# Patient Record
Sex: Female | Born: 1945 | Race: Black or African American | Hispanic: No | Marital: Married | Smoking: Never smoker
Health system: Southern US, Community
[De-identification: ages and names within clinical notes are randomized; demographics above are authoritative.]

## PROBLEM LIST (undated history)

## (undated) DIAGNOSIS — I10 Essential (primary) hypertension: Secondary | ICD-10-CM

## (undated) DIAGNOSIS — E079 Disorder of thyroid, unspecified: Secondary | ICD-10-CM

## (undated) HISTORY — DX: Disorder of thyroid, unspecified: E07.9

## (undated) HISTORY — DX: Essential (primary) hypertension: I10

## (undated) HISTORY — PX: BREAST SURGERY: SHX581

---

## 2004-10-22 ENCOUNTER — Ambulatory Visit: Payer: Self-pay

## 2005-11-22 ENCOUNTER — Ambulatory Visit: Payer: Self-pay | Admitting: Internal Medicine

## 2006-12-12 ENCOUNTER — Ambulatory Visit: Payer: Self-pay | Admitting: Internal Medicine

## 2007-01-23 ENCOUNTER — Ambulatory Visit: Payer: Self-pay | Admitting: Gastroenterology

## 2007-12-13 ENCOUNTER — Ambulatory Visit: Payer: Self-pay | Admitting: Internal Medicine

## 2008-12-16 ENCOUNTER — Ambulatory Visit: Payer: Self-pay | Admitting: Internal Medicine

## 2009-10-09 ENCOUNTER — Ambulatory Visit: Payer: Self-pay | Admitting: Internal Medicine

## 2009-12-17 ENCOUNTER — Ambulatory Visit: Payer: Self-pay | Admitting: Internal Medicine

## 2010-12-21 ENCOUNTER — Ambulatory Visit: Payer: Self-pay | Admitting: Internal Medicine

## 2011-12-29 ENCOUNTER — Ambulatory Visit: Payer: Self-pay | Admitting: Internal Medicine

## 2012-02-24 ENCOUNTER — Ambulatory Visit: Payer: Self-pay | Admitting: Gastroenterology

## 2013-01-18 ENCOUNTER — Ambulatory Visit: Payer: Self-pay | Admitting: Internal Medicine

## 2014-01-30 ENCOUNTER — Ambulatory Visit: Payer: Self-pay | Admitting: Internal Medicine

## 2014-08-18 DIAGNOSIS — H2513 Age-related nuclear cataract, bilateral: Secondary | ICD-10-CM | POA: Diagnosis not present

## 2014-10-30 DIAGNOSIS — I10 Essential (primary) hypertension: Secondary | ICD-10-CM | POA: Diagnosis not present

## 2014-10-30 DIAGNOSIS — Z124 Encounter for screening for malignant neoplasm of cervix: Secondary | ICD-10-CM | POA: Diagnosis not present

## 2014-10-30 DIAGNOSIS — E049 Nontoxic goiter, unspecified: Secondary | ICD-10-CM | POA: Diagnosis not present

## 2014-10-30 DIAGNOSIS — Z0001 Encounter for general adult medical examination with abnormal findings: Secondary | ICD-10-CM | POA: Diagnosis not present

## 2014-10-30 DIAGNOSIS — R300 Dysuria: Secondary | ICD-10-CM | POA: Diagnosis not present

## 2014-10-30 DIAGNOSIS — E539 Vitamin B deficiency, unspecified: Secondary | ICD-10-CM | POA: Diagnosis not present

## 2014-11-20 DIAGNOSIS — E049 Nontoxic goiter, unspecified: Secondary | ICD-10-CM | POA: Diagnosis not present

## 2015-02-06 DIAGNOSIS — Z1231 Encounter for screening mammogram for malignant neoplasm of breast: Secondary | ICD-10-CM | POA: Diagnosis not present

## 2015-02-20 DIAGNOSIS — Z23 Encounter for immunization: Secondary | ICD-10-CM | POA: Diagnosis not present

## 2015-11-05 DIAGNOSIS — I10 Essential (primary) hypertension: Secondary | ICD-10-CM | POA: Diagnosis not present

## 2015-11-05 DIAGNOSIS — Z0001 Encounter for general adult medical examination with abnormal findings: Secondary | ICD-10-CM | POA: Diagnosis not present

## 2015-11-05 DIAGNOSIS — E049 Nontoxic goiter, unspecified: Secondary | ICD-10-CM | POA: Diagnosis not present

## 2015-12-06 DIAGNOSIS — Z23 Encounter for immunization: Secondary | ICD-10-CM | POA: Diagnosis not present

## 2015-12-16 DIAGNOSIS — E042 Nontoxic multinodular goiter: Secondary | ICD-10-CM | POA: Diagnosis not present

## 2015-12-24 DIAGNOSIS — E042 Nontoxic multinodular goiter: Secondary | ICD-10-CM | POA: Diagnosis not present

## 2015-12-24 DIAGNOSIS — L723 Sebaceous cyst: Secondary | ICD-10-CM | POA: Diagnosis not present

## 2016-02-12 DIAGNOSIS — M810 Age-related osteoporosis without current pathological fracture: Secondary | ICD-10-CM | POA: Diagnosis not present

## 2016-02-12 DIAGNOSIS — Z1231 Encounter for screening mammogram for malignant neoplasm of breast: Secondary | ICD-10-CM | POA: Diagnosis not present

## 2016-02-19 DIAGNOSIS — Z23 Encounter for immunization: Secondary | ICD-10-CM | POA: Diagnosis not present

## 2016-08-11 DIAGNOSIS — H524 Presbyopia: Secondary | ICD-10-CM | POA: Diagnosis not present

## 2016-08-11 DIAGNOSIS — H35033 Hypertensive retinopathy, bilateral: Secondary | ICD-10-CM | POA: Diagnosis not present

## 2016-11-10 DIAGNOSIS — I10 Essential (primary) hypertension: Secondary | ICD-10-CM | POA: Diagnosis not present

## 2016-11-10 DIAGNOSIS — Z0001 Encounter for general adult medical examination with abnormal findings: Secondary | ICD-10-CM | POA: Diagnosis not present

## 2016-11-10 DIAGNOSIS — E042 Nontoxic multinodular goiter: Secondary | ICD-10-CM | POA: Diagnosis not present

## 2016-11-10 DIAGNOSIS — E559 Vitamin D deficiency, unspecified: Secondary | ICD-10-CM | POA: Diagnosis not present

## 2016-11-10 DIAGNOSIS — E039 Hypothyroidism, unspecified: Secondary | ICD-10-CM | POA: Diagnosis not present

## 2016-11-10 DIAGNOSIS — M5418 Radiculopathy, sacral and sacrococcygeal region: Secondary | ICD-10-CM | POA: Diagnosis not present

## 2016-11-28 DIAGNOSIS — E042 Nontoxic multinodular goiter: Secondary | ICD-10-CM | POA: Diagnosis not present

## 2017-02-08 DIAGNOSIS — H33032 Retinal detachment with giant retinal tear, left eye: Secondary | ICD-10-CM | POA: Diagnosis not present

## 2017-02-08 DIAGNOSIS — H4312 Vitreous hemorrhage, left eye: Secondary | ICD-10-CM | POA: Diagnosis not present

## 2017-02-15 DIAGNOSIS — H4312 Vitreous hemorrhage, left eye: Secondary | ICD-10-CM | POA: Diagnosis not present

## 2017-02-17 DIAGNOSIS — Z23 Encounter for immunization: Secondary | ICD-10-CM | POA: Diagnosis not present

## 2017-02-17 DIAGNOSIS — Z1231 Encounter for screening mammogram for malignant neoplasm of breast: Secondary | ICD-10-CM | POA: Diagnosis not present

## 2017-02-24 DIAGNOSIS — H4312 Vitreous hemorrhage, left eye: Secondary | ICD-10-CM | POA: Diagnosis not present

## 2017-03-31 DIAGNOSIS — H4312 Vitreous hemorrhage, left eye: Secondary | ICD-10-CM | POA: Diagnosis not present

## 2017-06-26 DIAGNOSIS — H2513 Age-related nuclear cataract, bilateral: Secondary | ICD-10-CM | POA: Diagnosis not present

## 2017-11-16 ENCOUNTER — Ambulatory Visit (INDEPENDENT_AMBULATORY_CARE_PROVIDER_SITE_OTHER): Payer: Medicare Other | Admitting: Nurse Practitioner

## 2017-11-16 ENCOUNTER — Other Ambulatory Visit: Payer: Self-pay

## 2017-11-16 ENCOUNTER — Encounter (INDEPENDENT_AMBULATORY_CARE_PROVIDER_SITE_OTHER): Payer: Self-pay

## 2017-11-16 ENCOUNTER — Other Ambulatory Visit: Payer: Self-pay | Admitting: Nurse Practitioner

## 2017-11-16 ENCOUNTER — Encounter: Payer: Self-pay | Admitting: Nurse Practitioner

## 2017-11-16 VITALS — BP 125/83 | HR 86 | Resp 16 | Ht 66.0 in | Wt 145.6 lb

## 2017-11-16 DIAGNOSIS — Z1239 Encounter for other screening for malignant neoplasm of breast: Secondary | ICD-10-CM

## 2017-11-16 DIAGNOSIS — M25512 Pain in left shoulder: Secondary | ICD-10-CM

## 2017-11-16 DIAGNOSIS — E559 Vitamin D deficiency, unspecified: Secondary | ICD-10-CM | POA: Diagnosis not present

## 2017-11-16 DIAGNOSIS — I739 Peripheral vascular disease, unspecified: Secondary | ICD-10-CM

## 2017-11-16 DIAGNOSIS — R300 Dysuria: Secondary | ICD-10-CM | POA: Diagnosis not present

## 2017-11-16 DIAGNOSIS — E039 Hypothyroidism, unspecified: Secondary | ICD-10-CM | POA: Diagnosis not present

## 2017-11-16 DIAGNOSIS — I10 Essential (primary) hypertension: Secondary | ICD-10-CM

## 2017-11-16 DIAGNOSIS — Z1231 Encounter for screening mammogram for malignant neoplasm of breast: Secondary | ICD-10-CM

## 2017-11-16 DIAGNOSIS — Z0001 Encounter for general adult medical examination with abnormal findings: Secondary | ICD-10-CM | POA: Diagnosis not present

## 2017-11-16 DIAGNOSIS — L209 Atopic dermatitis, unspecified: Secondary | ICD-10-CM | POA: Diagnosis not present

## 2017-11-16 MED ORDER — AMLODIPINE BESYLATE 5 MG PO TABS: 5.0000 mg | ORAL_TABLET | Freq: Every day | ORAL | 4 refills | Status: DC

## 2017-11-16 MED ORDER — CLOBETASOL PROPIONATE 0.05 % EX CREA: 1.0000 "application " | TOPICAL_CREAM | Freq: Two times a day (BID) | CUTANEOUS | 3 refills | Status: DC

## 2017-11-16 MED ORDER — LEVOTHYROXINE SODIUM 25 MCG PO TABS: 25.0000 ug | ORAL_TABLET | Freq: Every day | ORAL | 4 refills | Status: DC

## 2017-11-16 MED ORDER — TRAMADOL-ACETAMINOPHEN 37.5-325 MG PO TABS: 1.0000 | ORAL_TABLET | ORAL | 2 refills | Status: DC | PRN

## 2017-11-16 NOTE — Telephone Encounter (Signed)
Called in phar ulracet 30 tab with 2 refills

## 2017-11-16 NOTE — Progress Notes (Signed)
[facility] [place], [place] [address]  Internal MEDICINE  Office Visit Note  Patient Name: Carolyn Reyes  [id number]  [id number]  Date of Service: 12/06/2017    Pt is here for routine health maintenance examination  Chief Complaint  Patient presents with  . MEDICARE ANNUAL EXAM  . Shoulder Pain  . Varicose Veins     The patient is reporting pain in left shoulder. Hurts along shoulder blade and into the right shoulder joint. Limits ROM and strength.    Current Medication: Outpatient Encounter Medications as of 11/16/2017  Medication Sig  . aspirin 81 MG chewable tablet Chew 81 mg by mouth daily.  . Calcium Carbonate-Vit D-Min (CALCIUM 600+D3 PLUS MINERALS) 600-800 MG-UNIT TABS Take by mouth.  . [DISCONTINUED] amLODipine (NORVASC) 5 MG tablet   . [DISCONTINUED] amLODipine (NORVASC) 5 MG tablet Take 1 tablet (5 mg total) by mouth daily.  . [DISCONTINUED] clobetasol cream (TEMOVATE) 5.64 % Apply 1 application topically 2 (two) times daily.  . [DISCONTINUED] levothyroxine (SYNTHROID, LEVOTHROID) 25 MCG tablet TAKE 1 TABLET BY MOUTH ONCE DAILY IN THE MORNING FOR GOITER  . [DISCONTINUED] levothyroxine (SYNTHROID, LEVOTHROID) 25 MCG tablet Take 1 tablet (25 mcg total) by mouth daily before breakfast.  . [DISCONTINUED] traMADol-acetaminophen (ULTRACET) 37.5-325 MG tablet Take 1 tablet by mouth every 4 (four) hours as needed (PRN).  . [DISCONTINUED] traMADol-acetaminophen (ULTRACET) 37.5-325 MG tablet Take 1 tablet by mouth every 4 (four) hours as needed (PRN).   No facility-administered encounter medications on file as of 11/16/2017.     Surgical History: Past Surgical History:  Procedure Laterality Date  . BREAST SURGERY Left    FIBROID CYST REMOVED     Medical History: Past Medical History:  Diagnosis Date  . Hypertension   . Thyroid disease     Family History: Family History  Problem Relation Age of Onset  . Diabetes Mother   .  Hypertension Mother   . Hypertension Father       Review of Systems  Constitutional: Negative for activity change, chills, fatigue and unexpected weight change.  HENT: Negative for congestion, postnasal drip, rhinorrhea, sneezing and sore throat.   Eyes: Negative.  Negative for redness.  Respiratory: Negative for cough, chest tightness, shortness of breath and wheezing.   Cardiovascular: Negative for chest pain and palpitations.  Gastrointestinal: Negative for abdominal pain, constipation, diarrhea and nausea.  Endocrine: Negative for cold intolerance, heat intolerance, polydipsia, polyphagia and polyuria.       Well managed hypothyroid.   Genitourinary: Negative.  Negative for dysuria and frequency.  Musculoskeletal: Positive for arthralgias and myalgias. Negative for back pain, joint swelling and neck pain.       Left shoulder.   Skin: Negative for rash.  Allergic/Immunologic: Negative for environmental allergies.  Neurological: Negative for dizziness, tremors, numbness and headaches.  Hematological: Negative for adenopathy. Does not bruise/bleed easily.  Psychiatric/Behavioral: Negative for behavioral problems (Depression), dysphoric mood, sleep disturbance and suicidal ideas. The patient is not nervous/anxious.      Today's Vitals   11/16/17 0925  BP: 125/83  Pulse: 86  Resp: 16  SpO2: 99%  Weight: 145 lb 9.6 oz (66 kg)  Height: 5\' 6"  (1.676 m)    Physical Exam  Constitutional: She is oriented to person, place, and time. She appears well-developed and well-nourished. No distress.  HENT:  Head: Normocephalic and atraumatic.  Nose: Nose normal.  Mouth/Throat: Oropharynx is clear and moist. No oropharyngeal exudate.  Eyes: Pupils are equal, round,  and reactive to light. Conjunctivae and EOM are normal.  Neck: Normal range of motion. Neck supple. No JVD present. Carotid bruit is not present. No tracheal deviation present. No thyromegaly present.  Cardiovascular: Normal  rate, regular rhythm, normal heart sounds and intact distal pulses. Exam reveals no gallop and no friction rub.  No murmur heard. Mild swelling with significant varicose veins of both lower extremities.   Pulmonary/Chest: Effort normal and breath sounds normal. No respiratory distress. She has no wheezes. She has no rales. She exhibits no tenderness. Right breast exhibits no inverted nipple, no mass, no nipple discharge, no skin change and no tenderness. Left breast exhibits no inverted nipple, no mass, no nipple discharge, no skin change and no tenderness.  Abdominal: Soft. Bowel sounds are normal.  Musculoskeletal: Normal range of motion.       Arms: Lymphadenopathy:    She has no cervical adenopathy.  Neurological: She is alert and oriented to person, place, and time. No cranial nerve deficit. Coordination normal.  Skin: Skin is warm and dry. Capillary refill takes less than 2 seconds. She is not diaphoretic.  Psychiatric: She has a normal mood and affect. Her behavior is normal. Judgment and thought content normal.  Nursing note and vitals reviewed.    LABS: Recent Results (from the past 2160 hour(s))  Urinalysis, Routine w reflex microscopic     Status: None   Collection Time: 11/16/17  9:45 AM  Result Value Ref Range   Specific Gravity, UA 1.013 1.005 - 1.030   pH, UA 5.5 5.0 - 7.5   Color, UA Yellow Yellow   Appearance Ur Clear Clear   Leukocytes, UA Negative Negative   Protein, UA Negative Negative/Trace   Glucose, UA Negative Negative   Ketones, UA Negative Negative   RBC, UA Negative Negative   Bilirubin, UA Negative Negative   Urobilinogen, Ur 0.2 0.2 - 1.0 mg/dL   Nitrite, UA Negative Negative   Microscopic Examination Comment     Comment: Microscopic not indicated and not performed.  Comprehensive metabolic panel     Status: Abnormal   Collection Time: 11/16/17 11:18 AM  Result Value Ref Range   Glucose 95 65 - 99 mg/dL   BUN 13 8 - 27 mg/dL   Creatinine, Ser 0.78  0.57 - 1.00 mg/dL   GFR calc non Af Amer 77 >59 mL/min/1.73   GFR calc Af Amer 88 >59 mL/min/1.73   BUN/Creatinine Ratio 17 12 - 28   Sodium 139 134 - 144 mmol/L   Potassium 4.1 3.5 - 5.2 mmol/L   Chloride 104 96 - 106 mmol/L   CO2 23 20 - 29 mmol/L   Calcium 10.4 (H) 8.7 - 10.3 mg/dL   Total Protein 6.9 6.0 - 8.5 g/dL   Albumin 4.6 3.5 - 4.8 g/dL   Globulin, Total 2.3 1.5 - 4.5 g/dL   Albumin/Globulin Ratio 2.0 1.2 - 2.2   Bilirubin Total 0.4 0.0 - 1.2 mg/dL   Alkaline Phosphatase 106 39 - 117 IU/L   AST 18 0 - 40 IU/L   ALT 18 0 - 32 IU/L  CBC     Status: None   Collection Time: 11/16/17 11:18 AM  Result Value Ref Range   WBC 4.5 3.4 - 10.8 x10E3/uL   RBC 4.71 3.77 - 5.28 x10E6/uL   Hemoglobin 13.7 11.1 - 15.9 g/dL   Hematocrit 40.3 34.0 - 46.6 %   MCV 86 79 - 97 fL   MCH 29.1 26.6 - 33.0 pg  MCHC 34.0 31.5 - 35.7 g/dL   RDW 14.2 12.3 - 15.4 %   Platelets 291 150 - 450 x10E3/uL  Lipid Panel w/o Chol/HDL Ratio     Status: None   Collection Time: 11/16/17 11:18 AM  Result Value Ref Range   Cholesterol, Total 199 100 - 199 mg/dL   Triglycerides 108 0 - 149 mg/dL   HDL 82 >39 mg/dL   VLDL Cholesterol Cal 22 5 - 40 mg/dL   LDL Calculated 95 0 - 99 mg/dL  T4, free     Status: None   Collection Time: 11/16/17 11:18 AM  Result Value Ref Range   Free T4 1.33 0.82 - 1.77 ng/dL  TSH     Status: None   Collection Time: 11/16/17 11:18 AM  Result Value Ref Range   TSH 0.685 0.450 - 4.500 uIU/mL  VITAMIN D 25 Hydroxy (Vit-D Deficiency, Fractures)     Status: None   Collection Time: 11/16/17 11:18 AM  Result Value Ref Range   Vit D, 25-Hydroxy 49.4 30.0 - 100.0 ng/mL    Comment: Vitamin D deficiency has been defined by the [place] and an Endocrine Society practice guideline as a level of serum 25-OH vitamin D less than 20 ng/mL (1,2). The Endocrine Society went on to further define vitamin D insufficiency as a level between 21 and 29 ng/mL (2). 1. IOM (Institute  of Medicine). 2010. Dietary reference    intakes for calcium and D. [place]: The    [employer]. 2. Holick MF, Binkley , Bischoff-Ferrari HA, et al.    Evaluation, treatment, and prevention of vitamin D    deficiency: an Endocrine Society clinical practice    guideline. JCEM. 2011 Jul; 96(7):1911-30.     Assessment/Plan: 1. Encounter for general adult medical examination with abnormal findings Annual health maintenance exam today - CBC with Differential/Platelet; Future - Comprehensive metabolic panel; Future - Lipid panel; Future  2. Acute pain of left shoulder Recommend rest and low heat to affected area when needed. Tylenol or NSAID as needed and as indicated. Will refer to orthopedics if no improvement over next few weeks. May use prescribed tramadol/APAP for more severe pain. New prescrption for #30 tablets was sent to pharmacy.   3. Essential hypertension Stable. Continue bp medication as prescribe.d  - Lipid panel; Future  4. Acquired hypothyroidism Check thyroid panel and adjust levothyroxine as indicated.  - T4, free; Future - TSH; Future  5. Peripheral vascular disease (HCC) - VAS [place] LOWER EXTREMITY VENOUS REFLUX; Future  6. Atopic dermatitis, unspecified type Use clobetasol cream as needed and as prescribed   7. Vitamin D deficiency - Vitamin D 1,25 dihydroxy; Future  8. Screening for breast cancer - MM DIGITAL SCREENING BILATERAL; Future  9. Dysuria - Urinalysis, Routine w reflex microscopic  General Counseling: Carri verbalizes understanding of the findings of todays visit and agrees with plan of treatment. I have discussed any further diagnostic evaluation that may be needed or ordered today. We also reviewed her medications today. she has been encouraged to call the office with any questions or concerns that should arise related to todays visit.    Counseling:  Hypertension Counseling:   The following hypertensive lifestyle  modification were recommended and discussed:  1. Limiting alcohol intake to less than 1 oz/day of ethanol:(24 oz of beer or 8 oz of wine or 2 oz of 100-proof whiskey). 2. Take baby ASA 81 mg daily. 3. Importance of regular aerobic exercise and losing  weight. 4. Reduce dietary saturated fat and cholesterol intake for overall cardiovascular health. 5. Maintaining adequate dietary potassium, calcium, and magnesium intake. 6. Regular monitoring of the blood pressure. 7. Reduce sodium intake to less than 100 mmol/day (less than 2.3 gm of sodium or less than 6 gm of sodium choride)   This patient was seen by Leretha Pol, FNP- C in Collaboration with Dr Lavera Guise as a part of collaborative care agreement  Orders Placed This Encounter  Procedures  . MM DIGITAL SCREENING BILATERAL  . Urinalysis, Routine w reflex microscopic  . CBC with Differential/Platelet  . Comprehensive metabolic panel  . T4, free  . TSH  . Lipid panel  . Vitamin D 1,25 dihydroxy    Meds ordered this encounter  Medications  . DISCONTD: amLODipine (NORVASC) 5 MG tablet    Sig: Take 1 tablet (5 mg total) by mouth daily.    Dispense:  90 tablet    Refill:  4    Order Specific Question:   Supervising Provider    Answer:   Lavera Guise [[id number]]  . DISCONTD: levothyroxine (SYNTHROID, LEVOTHROID) 25 MCG tablet    Sig: Take 1 tablet (25 mcg total) by mouth daily before breakfast.    Dispense:  90 tablet    Refill:  4    Order Specific Question:   Supervising Provider    Answer:   Lavera Guise [place]  . DISCONTD: traMADol-acetaminophen (ULTRACET) 37.5-325 MG tablet    Sig: Take 1 tablet by mouth every 4 (four) hours as needed (PRN).    Dispense:  30 tablet    Refill:  2    Order Specific Question:   Supervising Provider    Answer:   Lavera Guise [[id number]]  . DISCONTD: clobetasol cream (TEMOVATE) 0.05 %    Sig: Apply 1 application topically 2 (two) times daily.    Dispense:  45 g    Refill:  3    Order Specific  Question:   Supervising Provider    Answer:   Lavera Guise [[id number]]    Time spent: [place], MD  Internal Medicine

## 2017-11-17 LAB — URINALYSIS, ROUTINE W REFLEX MICROSCOPIC
Bilirubin, UA: NEGATIVE
Glucose, UA: NEGATIVE
KETONES UA: NEGATIVE
Leukocytes, UA: NEGATIVE
NITRITE UA: NEGATIVE
Protein, UA: NEGATIVE
RBC, UA: NEGATIVE
Specific Gravity, UA: 1.013 (ref 1.005–1.030)
UUROB: 0.2 mg/dL (ref 0.2–1.0)
pH, UA: 5.5 (ref 5.0–7.5)

## 2017-11-17 LAB — LIPID PANEL W/O CHOL/HDL RATIO
Cholesterol, Total: 199 mg/dL (ref 100–199)
HDL: 82 mg/dL (ref >39)
LDL Calculated: 95 mg/dL (ref 0–99)
Triglycerides: 108 mg/dL (ref 0–149)
VLDL Cholesterol Cal: 22 mg/dL (ref 5–40)

## 2017-11-17 LAB — COMPREHENSIVE METABOLIC PANEL
ALBUMIN: 4.6 g/dL (ref 3.5–4.8)
ALT: 18 IU/L (ref 0–32)
AST: 18 IU/L (ref 0–40)
Albumin/Globulin Ratio: 2 (ref 1.2–2.2)
Alkaline Phosphatase: 106 IU/L (ref 39–117)
BILIRUBIN TOTAL: 0.4 mg/dL (ref 0.0–1.2)
BUN / CREAT RATIO: 17 (ref 12–28)
BUN: 13 mg/dL (ref 8–27)
CHLORIDE: 104 mmol/L (ref 96–106)
CO2: 23 mmol/L (ref 20–29)
Calcium: 10.4 mg/dL — ABNORMAL HIGH (ref 8.7–10.3)
Creatinine, Ser: 0.78 mg/dL (ref 0.57–1.00)
GFR calc non Af Amer: 77 mL/min/{1.73_m2} (ref >59)
GFR, EST AFRICAN AMERICAN: 88 mL/min/{1.73_m2} (ref >59)
GLOBULIN, TOTAL: 2.3 g/dL (ref 1.5–4.5)
Glucose: 95 mg/dL (ref 65–99)
Potassium: 4.1 mmol/L (ref 3.5–5.2)
SODIUM: 139 mmol/L (ref 134–144)
Total Protein: 6.9 g/dL (ref 6.0–8.5)

## 2017-11-17 LAB — CBC
Hematocrit: 40.3 % (ref 34.0–46.6)
Hemoglobin: 13.7 g/dL (ref 11.1–15.9)
MCH: 29.1 pg (ref 26.6–33.0)
MCHC: 34 g/dL (ref 31.5–35.7)
MCV: 86 fL (ref 79–97)
PLATELETS: 291 10*3/uL (ref 150–450)
RBC: 4.71 x10E6/uL (ref 3.77–5.28)
RDW: 14.2 % (ref 12.3–15.4)
WBC: 4.5 10*3/uL (ref 3.4–10.8)

## 2017-11-17 LAB — TSH: TSH: 0.685 u[IU]/mL (ref 0.450–4.500)

## 2017-11-17 LAB — T4, FREE: Free T4: 1.33 ng/dL (ref 0.82–1.77)

## 2017-11-17 LAB — VITAMIN D 25 HYDROXY (VIT D DEFICIENCY, FRACTURES): Vit D, 25-Hydroxy: 49.4 ng/mL (ref 30.0–100.0)

## 2017-11-22 ENCOUNTER — Other Ambulatory Visit: Payer: Self-pay

## 2017-11-22 ENCOUNTER — Other Ambulatory Visit: Payer: Self-pay | Admitting: Nurse Practitioner

## 2017-11-22 DIAGNOSIS — L209 Atopic dermatitis, unspecified: Secondary | ICD-10-CM

## 2017-11-22 DIAGNOSIS — I10 Essential (primary) hypertension: Secondary | ICD-10-CM

## 2017-11-22 DIAGNOSIS — E039 Hypothyroidism, unspecified: Secondary | ICD-10-CM

## 2017-11-22 DIAGNOSIS — M25512 Pain in left shoulder: Secondary | ICD-10-CM

## 2017-11-22 MED ORDER — CLOBETASOL PROPIONATE 0.05 % EX CREA: 1.0000 "application " | TOPICAL_CREAM | Freq: Two times a day (BID) | CUTANEOUS | 3 refills | Status: DC

## 2017-11-22 MED ORDER — AMLODIPINE BESYLATE 5 MG PO TABS: 5.0000 mg | ORAL_TABLET | Freq: Every day | ORAL | 4 refills | Status: DC

## 2017-11-22 MED ORDER — LEVOTHYROXINE SODIUM 25 MCG PO TABS: 25.0000 ug | ORAL_TABLET | Freq: Every day | ORAL | 4 refills | Status: DC

## 2017-11-22 MED ORDER — TRAMADOL-ACETAMINOPHEN 37.5-325 MG PO TABS: 1.0000 | ORAL_TABLET | ORAL | 2 refills | Status: DC | PRN

## 2017-11-23 ENCOUNTER — Telehealth: Payer: Self-pay

## 2017-11-23 NOTE — Telephone Encounter (Signed)
Pt advised labs came back normal  

## 2017-11-23 NOTE — Telephone Encounter (Signed)
-----   Message from Ronnell Freshwater, NP sent at 11/19/2017  6:44 PM EDT ----- Please let the patient know that labs are good. Thanks.

## 2017-12-06 DIAGNOSIS — R300 Dysuria: Secondary | ICD-10-CM | POA: Insufficient documentation

## 2017-12-06 DIAGNOSIS — Z1239 Encounter for other screening for malignant neoplasm of breast: Secondary | ICD-10-CM

## 2017-12-06 DIAGNOSIS — I10 Essential (primary) hypertension: Secondary | ICD-10-CM | POA: Insufficient documentation

## 2017-12-06 DIAGNOSIS — E559 Vitamin D deficiency, unspecified: Secondary | ICD-10-CM | POA: Insufficient documentation

## 2017-12-06 DIAGNOSIS — Z0001 Encounter for general adult medical examination with abnormal findings: Secondary | ICD-10-CM | POA: Insufficient documentation

## 2017-12-06 DIAGNOSIS — I739 Peripheral vascular disease, unspecified: Secondary | ICD-10-CM | POA: Insufficient documentation

## 2017-12-06 DIAGNOSIS — E039 Hypothyroidism, unspecified: Secondary | ICD-10-CM | POA: Insufficient documentation

## 2017-12-06 DIAGNOSIS — L209 Atopic dermatitis, unspecified: Secondary | ICD-10-CM | POA: Insufficient documentation

## 2017-12-06 DIAGNOSIS — M25512 Pain in left shoulder: Secondary | ICD-10-CM | POA: Insufficient documentation

## 2018-01-26 ENCOUNTER — Other Ambulatory Visit: Payer: Self-pay

## 2018-02-19 ENCOUNTER — Ambulatory Visit (INDEPENDENT_AMBULATORY_CARE_PROVIDER_SITE_OTHER): Payer: Medicare Other

## 2018-02-19 DIAGNOSIS — Z23 Encounter for immunization: Secondary | ICD-10-CM

## 2018-02-19 DIAGNOSIS — Z1231 Encounter for screening mammogram for malignant neoplasm of breast: Secondary | ICD-10-CM | POA: Diagnosis not present

## 2018-03-02 ENCOUNTER — Ambulatory Visit: Payer: Self-pay

## 2018-03-12 DIAGNOSIS — R928 Other abnormal and inconclusive findings on diagnostic imaging of breast: Secondary | ICD-10-CM | POA: Diagnosis not present

## 2018-07-16 DIAGNOSIS — H35463 Secondary vitreoretinal degeneration, bilateral: Secondary | ICD-10-CM | POA: Diagnosis not present

## 2018-07-16 DIAGNOSIS — H4311 Vitreous hemorrhage, right eye: Secondary | ICD-10-CM | POA: Diagnosis not present

## 2018-07-16 DIAGNOSIS — H43813 Vitreous degeneration, bilateral: Secondary | ICD-10-CM | POA: Diagnosis not present

## 2018-07-23 DIAGNOSIS — H43813 Vitreous degeneration, bilateral: Secondary | ICD-10-CM | POA: Diagnosis not present

## 2018-07-23 DIAGNOSIS — H4311 Vitreous hemorrhage, right eye: Secondary | ICD-10-CM | POA: Diagnosis not present

## 2018-07-23 DIAGNOSIS — H35463 Secondary vitreoretinal degeneration, bilateral: Secondary | ICD-10-CM | POA: Diagnosis not present

## 2018-08-02 ENCOUNTER — Telehealth: Payer: Self-pay

## 2018-08-02 NOTE — Telephone Encounter (Signed)
Her last labs are from 10/2017 and thyroid panel was normal at that time. She should continue the levothyroxine as currenlty prescribed, as it was perfect .

## 2018-08-02 NOTE — Telephone Encounter (Signed)
Pt advised we discuss in detailed in next visit

## 2018-10-23 ENCOUNTER — Other Ambulatory Visit: Payer: Self-pay

## 2018-10-23 DIAGNOSIS — I10 Essential (primary) hypertension: Secondary | ICD-10-CM

## 2018-10-23 DIAGNOSIS — E039 Hypothyroidism, unspecified: Secondary | ICD-10-CM

## 2018-10-23 MED ORDER — LEVOTHYROXINE SODIUM 25 MCG PO TABS: 25.0000 ug | ORAL_TABLET | Freq: Every day | ORAL | 0 refills | Status: DC

## 2018-10-23 MED ORDER — AMLODIPINE BESYLATE 5 MG PO TABS
5.0000 mg | ORAL_TABLET | Freq: Every day | ORAL | 0 refills | Status: DC
Start: 2018-10-23 — End: 2018-10-24

## 2018-10-24 ENCOUNTER — Other Ambulatory Visit: Payer: Self-pay

## 2018-10-24 DIAGNOSIS — I10 Essential (primary) hypertension: Secondary | ICD-10-CM

## 2018-10-24 DIAGNOSIS — E039 Hypothyroidism, unspecified: Secondary | ICD-10-CM

## 2018-10-24 MED ORDER — LEVOTHYROXINE SODIUM 25 MCG PO TABS: 25.0000 ug | ORAL_TABLET | Freq: Every day | ORAL | 0 refills | Status: DC

## 2018-10-24 MED ORDER — AMLODIPINE BESYLATE 5 MG PO TABS: 5.0000 mg | ORAL_TABLET | Freq: Every day | ORAL | 0 refills | Status: DC

## 2018-11-19 ENCOUNTER — Other Ambulatory Visit: Payer: Self-pay

## 2018-11-19 ENCOUNTER — Ambulatory Visit (INDEPENDENT_AMBULATORY_CARE_PROVIDER_SITE_OTHER): Payer: Medicare Other | Admitting: Nurse Practitioner

## 2018-11-19 ENCOUNTER — Other Ambulatory Visit: Payer: Self-pay | Admitting: Nurse Practitioner

## 2018-11-19 ENCOUNTER — Encounter: Payer: Self-pay | Admitting: Nurse Practitioner

## 2018-11-19 VITALS — BP 127/76 | HR 86 | Resp 16 | Ht 66.0 in | Wt 149.2 lb

## 2018-11-19 DIAGNOSIS — I10 Essential (primary) hypertension: Secondary | ICD-10-CM | POA: Diagnosis not present

## 2018-11-19 DIAGNOSIS — D242 Benign neoplasm of left breast: Secondary | ICD-10-CM

## 2018-11-19 DIAGNOSIS — L608 Other nail disorders: Secondary | ICD-10-CM

## 2018-11-19 DIAGNOSIS — M545 Low back pain, unspecified: Secondary | ICD-10-CM | POA: Insufficient documentation

## 2018-11-19 DIAGNOSIS — Z0001 Encounter for general adult medical examination with abnormal findings: Secondary | ICD-10-CM | POA: Diagnosis not present

## 2018-11-19 DIAGNOSIS — Z1239 Encounter for other screening for malignant neoplasm of breast: Secondary | ICD-10-CM

## 2018-11-19 DIAGNOSIS — E559 Vitamin D deficiency, unspecified: Secondary | ICD-10-CM | POA: Diagnosis not present

## 2018-11-19 DIAGNOSIS — R300 Dysuria: Secondary | ICD-10-CM

## 2018-11-19 DIAGNOSIS — E039 Hypothyroidism, unspecified: Secondary | ICD-10-CM | POA: Diagnosis not present

## 2018-11-19 LAB — LIPID PANEL W/O CHOL/HDL RATIO

## 2018-11-19 MED ORDER — TRAMADOL-ACETAMINOPHEN 37.5-325 MG PO TABS: 1.0000 | ORAL_TABLET | ORAL | 2 refills | Status: DC | PRN

## 2018-11-19 NOTE — Progress Notes (Signed)
[facility] [place], [place] [address]  Internal MEDICINE  Office Visit Note  Patient Name: Carolyn Reyes  [id number]  [id number]  Date of Service: 11/19/2018  Chief Complaint  Patient presents with  . Medicare Wellness    pt have some concerns about medications,  . Hypertension  . Hypothyroidism  . Insect Bite    pt have chigger bites on her legs from working in the yard, tried calomine lotion it didnt work     The patient is here for heath maintenance exam. She states that she has intermittent lower back pain whch radiates into her legs. She will take tramadol/APAP 37.5/325mg  when pain is severe. Last prescription was written for her in 04/2018, and there are still several pills left in the bottle. She states that the toenail of the big toe is starting to peel away from the nail bed. Denies pain or evidence of infection. She is due to have routine, fasting labs drawn. She will be due for screening mammogram in 01/2019.   Pt is here for routine health maintenance examination  Current Medication: Outpatient Encounter Medications as of 11/19/2018  Medication Sig  . amLODipine (NORVASC) 5 MG tablet Take 1 tablet (5 mg total) by mouth daily.  Marland Kitchen aspirin 81 MG chewable tablet Chew 81 mg by mouth daily.  . Calcium Carbonate-Vit D-Min (CALCIUM 600+D3 PLUS MINERALS) 600-800 MG-UNIT TABS Take by mouth.  . clobetasol cream (TEMOVATE) 5.36 % Apply 1 application topically 2 (two) times daily.  Marland Kitchen levothyroxine (SYNTHROID) 25 MCG tablet Take 1 tablet (25 mcg total) by mouth daily before breakfast.  . traMADol-acetaminophen (ULTRACET) 37.5-325 MG tablet Take 1 tablet by mouth every 4 (four) hours as needed (PRN).  . [DISCONTINUED] traMADol-acetaminophen (ULTRACET) 37.5-325 MG tablet Take 1 tablet by mouth every 4 (four) hours as needed (PRN).  . [DISCONTINUED] traMADol-acetaminophen (ULTRACET) 37.5-325 MG tablet Take 1 tablet by mouth every 4 (four) hours as needed  (PRN).   No facility-administered encounter medications on file as of 11/19/2018.     Surgical History: Past Surgical History:  Procedure Laterality Date  . BREAST SURGERY Left    FIBROID CYST REMOVED     Medical History: Past Medical History:  Diagnosis Date  . Hypertension   . Thyroid disease     Family History: Family History  Problem Relation Age of Onset  . Diabetes Mother   . Hypertension Mother   . Hypertension Father       Review of Systems  Constitutional: Negative for chills, fatigue and unexpected weight change.  HENT: Negative for congestion, postnasal drip, rhinorrhea, sneezing and sore throat.   Respiratory: Negative for cough, chest tightness, shortness of breath and wheezing.   Cardiovascular: Negative for chest pain and palpitations.  Gastrointestinal: Negative for abdominal pain, constipation, diarrhea, nausea and vomiting.  Endocrine: Negative for cold intolerance, heat intolerance and polydipsia.  Genitourinary: Negative for dysuria and frequency.  Musculoskeletal: Positive for back pain and myalgias. Negative for arthralgias, joint swelling and neck pain.       Low back pain radiates into the legs. This is intermittent and made worse by exertion.   Skin: Negative for rash.       Toenail of right big toe is coming away from the nail bed. No pain or drainage present.   Allergic/Immunologic: Negative for environmental allergies.  Neurological: Negative for dizziness, tremors, numbness and headaches.  Hematological: Negative for adenopathy. Does not bruise/bleed easily.  Psychiatric/Behavioral: Negative for behavioral problems (Depression), sleep  disturbance and suicidal ideas. The patient is not nervous/anxious.     Today's Vitals   11/19/18 0917  BP: 127/76  Pulse: 86  Resp: 16  SpO2: 98%  Weight: 149 lb 3.2 oz (67.7 kg)  Height: 5\' 6"  (1.676 m)   Body mass index is 24.08 kg/m.  Physical Exam Vitals signs and nursing note reviewed.   Constitutional:      General: She is not in acute distress.    Appearance: Normal appearance. She is well-developed. She is not diaphoretic.  HENT:     Head: Normocephalic and atraumatic.     Mouth/Throat:     Pharynx: No oropharyngeal exudate.  Eyes:     Pupils: Pupils are equal, round, and reactive to light.  Neck:     Musculoskeletal: Normal range of motion and neck supple.     Thyroid: No thyromegaly.     Vascular: No carotid bruit or JVD.     Trachea: No tracheal deviation.  Cardiovascular:     Rate and Rhythm: Normal rate and regular rhythm.     Pulses: Normal pulses.     Heart sounds: Normal heart sounds. No murmur. No friction rub. No gallop.   Pulmonary:     Effort: Pulmonary effort is normal. No respiratory distress.     Breath sounds: Normal breath sounds. No wheezing or rales.  Chest:     Chest wall: No tenderness.     Breasts:        Right: Mass present. No swelling, bleeding, inverted nipple, nipple discharge, skin change or tenderness.        Left: Normal. No swelling, bleeding, inverted nipple, mass, nipple discharge, skin change or tenderness.     Comments: There is smooth, palpable area of fibrous tissue to the lower, inner quadrant of the right breat. It is not fixed and nontender.  Abdominal:     General: Bowel sounds are normal.     Palpations: Abdomen is soft.     Tenderness: There is no abdominal tenderness.  Musculoskeletal: Normal range of motion.  Lymphadenopathy:     Cervical: No cervical adenopathy.  Skin:    General: Skin is warm and dry.     Comments: Toenail on right great toe is abnormally thick and is pulling away from the nail bed. There is no inflammation or evidence of acute infection. No tenderness with palpatin.   Neurological:     Mental Status: She is alert and oriented to person, place, and time.     Cranial Nerves: No cranial nerve deficit.  Psychiatric:        Behavior: Behavior normal.        Thought Content: Thought content  normal.        Judgment: Judgment normal.    Depression screen [facility] 2/9 11/19/2018 11/16/2017  Decreased Interest 0 0  Down, Depressed, Hopeless 0 0  PHQ - 2 Score 0 0    Functional Status Survey: Is the patient deaf or have difficulty hearing?: No Does the patient have difficulty seeing, even when wearing glasses/contacts?: No Does the patient have difficulty concentrating, remembering, or making decisions?: No Does the patient have difficulty walking or climbing stairs?: No Does the patient have difficulty dressing or bathing?: No Does the patient have difficulty doing errands alone such as visiting a doctor's office or shopping?: No  MMSE - [place] Exam 11/19/2018 11/16/2017  Orientation to time 5 5  Orientation to Place 5 5  Registration 3 3  Attention/ Calculation 5  5  Recall 3 3  Language- name 2 objects 2 2  Language- repeat 1 1  Language- follow 3 step command 3 3  Language- read & follow direction 1 1  Write a sentence 1 1  Copy design 1 1  Total score 30 30    Fall Risk  11/19/2018 11/16/2017  Falls in the past year? 0 Yes  Number falls in past yr: - 1  Injury with Fall? - No   Assessment/Plan: 1. Encounter for general adult medical examination with abnormal findings Annual health maintenance exam today.   2. Essential hypertension Stable. Continue bp medication as prescribed   3. Acquired hypothyroidism Check thyroid panel and adjust levothyroxine as indicated.   4. Low back pain potentially associated with radiculopathy May continue tramadol/APAP 37.5/325mg  tablets as needed and as prescribed for moderate to severe pain. A new prescription was sent to her pharmacy.  - traMADol-acetaminophen (ULTRACET) 37.5-325 MG tablet; Take 1 tablet by mouth every 4 (four) hours as needed (PRN).  Dispense: 30 tablet; Refill: 2  5. Acquired deformity of toenail - Ambulatory referral to Podiatry  6. Benign neoplasm of right breast Palpable, fibrous mass in lower,  inner quadrant of right breast. Screening mammogram to be done 02/21/2019/ further evaluation as indicated.   7. Screening for breast cancer - MM DIGITAL SCREENING BILATERAL; Future  8. Dysuria - UA/M w/rflx Culture, Routine  General Counseling: Laiken verbalizes understanding of the findings of todays visit and agrees with plan of treatment. I have discussed any further diagnostic evaluation that may be needed or ordered today. We also reviewed her medications today. she has been encouraged to call the office with any questions or concerns that should arise related to todays visit.    Counseling:  Hypertension Counseling:   The following hypertensive lifestyle modification were recommended and discussed:  1. Limiting alcohol intake to less than 1 oz/day of ethanol:(24 oz of beer or 8 oz of wine or 2 oz of 100-proof whiskey). 2. Take baby ASA 81 mg daily. 3. Importance of regular aerobic exercise and losing weight. 4. Reduce dietary saturated fat and cholesterol intake for overall cardiovascular health. 5. Maintaining adequate dietary potassium, calcium, and magnesium intake. 6. Regular monitoring of the blood pressure. 7. Reduce sodium intake to less than 100 mmol/day (less than 2.3 gm of sodium or less than 6 gm of sodium choride)   This patient was seen by [place] with Dr Lavera Guise as a part of collaborative care agreement  Orders Placed This Encounter  Procedures  . MM DIGITAL SCREENING BILATERAL  . UA/M w/rflx Culture, Routine  . Ambulatory referral to [place] ordered this encounter  Medications  . DISCONTD: traMADol-acetaminophen (ULTRACET) 37.5-325 MG tablet    Sig: Take 1 tablet by mouth every 4 (four) hours as needed (PRN).    Dispense:  30 tablet    Refill:  2    Order Specific Question:   Supervising Provider    Answer:   Lavera Guise [[id number]]  . traMADol-acetaminophen (ULTRACET) 37.5-325 MG tablet    Sig: Take 1 tablet by mouth  every 4 (four) hours as needed (PRN).    Dispense:  30 tablet    Refill:  2    Order Specific Question:   Supervising Provider    Answer:   Lavera Guise [[id number]]    Time spent: [place], MD  Internal Medicine

## 2018-11-20 LAB — COMPREHENSIVE METABOLIC PANEL
ALT: 26 IU/L (ref 0–32)
AST: 25 IU/L (ref 0–40)
Albumin/Globulin Ratio: 1.9 (ref 1.2–2.2)
Albumin: 4.5 g/dL (ref 3.7–4.7)
Alkaline Phosphatase: 107 IU/L (ref 39–117)
BUN/Creatinine Ratio: 15 (ref 12–28)
BUN: 12 mg/dL (ref 8–27)
Bilirubin Total: 0.4 mg/dL (ref 0.0–1.2)
CO2: 23 mmol/L (ref 20–29)
Calcium: 10.6 mg/dL — ABNORMAL HIGH (ref 8.7–10.3)
Chloride: 104 mmol/L (ref 96–106)
Creatinine, Ser: 0.81 mg/dL (ref 0.57–1.00)
GFR calc Af Amer: 84 mL/min/{1.73_m2} (ref >59)
GFR calc non Af Amer: 73 mL/min/{1.73_m2} (ref >59)
Globulin, Total: 2.4 g/dL (ref 1.5–4.5)
Glucose: 100 mg/dL — ABNORMAL HIGH (ref 65–99)
Potassium: 4.2 mmol/L (ref 3.5–5.2)
Sodium: 141 mmol/L (ref 134–144)
Total Protein: 6.9 g/dL (ref 6.0–8.5)

## 2018-11-20 LAB — UA/M W/RFLX CULTURE, ROUTINE
Bilirubin, UA: NEGATIVE
Glucose, UA: NEGATIVE
Ketones, UA: NEGATIVE
Leukocytes,UA: NEGATIVE
Nitrite, UA: NEGATIVE
Protein,UA: NEGATIVE
RBC, UA: NEGATIVE
Specific Gravity, UA: 1.012 (ref 1.005–1.030)
Urobilinogen, Ur: 0.2 mg/dL (ref 0.2–1.0)
pH, UA: 6.5 (ref 5.0–7.5)

## 2018-11-20 LAB — MICROSCOPIC EXAMINATION
Casts: NONE SEEN /lpf
Epithelial Cells (non renal): NONE SEEN /hpf (ref 0–10)

## 2018-11-20 LAB — CBC
Hematocrit: 41.6 % (ref 34.0–46.6)
Hemoglobin: 14.4 g/dL (ref 11.1–15.9)
MCH: 29.3 pg (ref 26.6–33.0)
MCHC: 34.6 g/dL (ref 31.5–35.7)
MCV: 85 fL (ref 79–97)
Platelets: 259 10*3/uL (ref 150–450)
RBC: 4.91 x10E6/uL (ref 3.77–5.28)
RDW: 12.6 % (ref 11.7–15.4)
WBC: 3.9 10*3/uL (ref 3.4–10.8)

## 2018-11-20 LAB — LIPID PANEL W/O CHOL/HDL RATIO
Cholesterol, Total: 210 mg/dL — ABNORMAL HIGH (ref 100–199)
HDL: 65 mg/dL (ref >39)
LDL Calculated: 123 mg/dL — ABNORMAL HIGH (ref 0–99)
Triglycerides: 111 mg/dL (ref 0–149)
VLDL Cholesterol Cal: 22 mg/dL (ref 5–40)

## 2018-11-20 LAB — T4, FREE: Free T4: 1.12 ng/dL (ref 0.82–1.77)

## 2018-11-20 LAB — VITAMIN D 25 HYDROXY (VIT D DEFICIENCY, FRACTURES): Vit D, 25-Hydroxy: 59.4 ng/mL (ref 30.0–100.0)

## 2018-11-20 LAB — T3: T3, Total: 113 ng/dL (ref 71–180)

## 2018-11-20 LAB — TSH: TSH: 0.666 u[IU]/mL (ref 0.450–4.500)

## 2018-11-26 ENCOUNTER — Encounter: Payer: Self-pay | Admitting: Podiatry

## 2018-11-26 ENCOUNTER — Ambulatory Visit: Payer: Medicare Other | Admitting: Podiatry

## 2018-11-26 ENCOUNTER — Other Ambulatory Visit: Payer: Self-pay

## 2018-11-26 DIAGNOSIS — L608 Other nail disorders: Secondary | ICD-10-CM | POA: Diagnosis not present

## 2018-11-26 DIAGNOSIS — L603 Nail dystrophy: Secondary | ICD-10-CM | POA: Diagnosis not present

## 2018-11-26 DIAGNOSIS — B351 Tinea unguium: Secondary | ICD-10-CM

## 2018-11-26 NOTE — Progress Notes (Signed)
This patient presents the office for an evaluation and treatment of her big toenail right foot.  She presents the office today stating that her toenail is unattached to the nailbed but is causing no pain or discomfort.  She was referred to this office by her medical doctor for removal of the toenail.  Patient denies any drainage or pain associated with this right toenail.  She says that the nail has been lifting off the nail bed for approximately 2 months.  She presents the office today for an evaluation and treatment of this toenail right big toe.  General Appearance  Alert, conversant and in no acute stress.  Vascular  Dorsalis pedis and posterior tibial  pulses are palpable  bilaterally.  Capillary return is within normal limits  bilaterally. Temperature is within normal limits  bilaterally.  Neurologic  Senn-Weinstein monofilament wire test within normal limits  bilaterally. Muscle power within normal limits bilaterally.  Nails Thick disfigured discolored nails with subungual debris  from hallux  To fifth toenail  B/L.  Right hallux nail plate was unattached at its distal 66% of nail bed,    Orthopedic  No limitations of motion  feet .  No crepitus or effusions noted.  No bony pathology or digital deformities noted.  Skin  normotropic skin with no porokeratosis noted bilaterally.  No signs of infections or ulcers noted.    Nail Dystrophy  Onychomycosis  B/L.  IE.  Debride unattached nail plate from right hallux.   Prescribe Miracle 7 for application nail bed right hallux.  Discussed temporary excision of the nail plate versus excision of the nail plate with cauterization.  Told the patient to that I would recommend we treat her conservatively prior to performing any definitive surgery.  Patient also has been diagnosed as having PVD in her medical diagnosis.  Patient to return to the office as needed   Gardiner Barefoot DPM

## 2018-11-27 ENCOUNTER — Telehealth: Payer: Self-pay | Admitting: Podiatry

## 2018-11-27 ENCOUNTER — Telehealth: Payer: Self-pay | Admitting: *Deleted

## 2018-11-27 NOTE — Telephone Encounter (Signed)
Patient wants to know if she should keep bandage on all day, or just put bandage on at night before bed.

## 2018-11-27 NOTE — Telephone Encounter (Signed)
Returned call to patient and informed per Dr. Prudence Davidson that she is to put the Miracle 7 on nails w/ bandage at night only and remove bandage in morning. She verbalized understanding and stated ok.

## 2019-01-08 ENCOUNTER — Telehealth: Payer: Self-pay | Admitting: Podiatry

## 2019-01-08 NOTE — Telephone Encounter (Signed)
Pt was seen in office on 11/26/18 and had part of a toenail removed and would like to know when can stop applying the polish to the area. Please give patient a call.

## 2019-01-08 NOTE — Telephone Encounter (Signed)
I spoke with patient and all questions were answered regarding her toenail.

## 2019-01-16 NOTE — Progress Notes (Signed)
Hey. I thought I had taken care of this, but I think now I did not. Please let the patient know that her lab work showed mildly elevated calcium levels. I would like to send her lab slip to have this rechecked. Her other labs were good. Will mail her lab sip. Thanks.

## 2019-01-17 ENCOUNTER — Telehealth: Payer: Self-pay

## 2019-01-17 NOTE — Telephone Encounter (Signed)
CALLED PT PER HEATHER TO LET PT KNOW THAT HER LABS LOOKED GOOD BUT HER CALCIUM LEVELS ARE MILDLY ELEVATED AND WE MAILED IN ANOTHER LAB SLIP FOR HER TO GET THEM RECHECKED.

## 2019-01-17 NOTE — Progress Notes (Signed)
Three 600mg  calcium tablets? Let's have her cut back to one. Then recheck labs with slip in about 3 weeks. Thanks.

## 2019-01-22 ENCOUNTER — Other Ambulatory Visit: Payer: Self-pay | Admitting: Nurse Practitioner

## 2019-01-23 LAB — BASIC METABOLIC PANEL
BUN/Creatinine Ratio: 11 — ABNORMAL LOW (ref 12–28)
BUN: 9 mg/dL (ref 8–27)
CO2: 22 mmol/L (ref 20–29)
Calcium: 10 mg/dL (ref 8.7–10.3)
Chloride: 107 mmol/L — ABNORMAL HIGH (ref 96–106)
Creatinine, Ser: 0.85 mg/dL (ref 0.57–1.00)
GFR calc Af Amer: 79 mL/min/{1.73_m2} (ref >59)
GFR calc non Af Amer: 69 mL/min/{1.73_m2} (ref >59)
Glucose: 108 mg/dL — ABNORMAL HIGH (ref 65–99)
Potassium: 3.7 mmol/L (ref 3.5–5.2)
Sodium: 143 mmol/L (ref 134–144)

## 2019-01-23 LAB — PARATHYROID HORMONE, INTACT (NO CA): PTH: 29 pg/mL (ref 15–65)

## 2019-01-23 NOTE — Progress Notes (Signed)
Please let the patient know that her repeat labs looked good. Thanks.

## 2019-01-24 ENCOUNTER — Telehealth: Payer: Self-pay

## 2019-01-24 NOTE — Telephone Encounter (Signed)
Pt was informed per Nira Conn that her repeat labs were good

## 2019-01-30 ENCOUNTER — Other Ambulatory Visit: Payer: Self-pay

## 2019-01-30 DIAGNOSIS — I10 Essential (primary) hypertension: Secondary | ICD-10-CM

## 2019-01-30 DIAGNOSIS — E039 Hypothyroidism, unspecified: Secondary | ICD-10-CM

## 2019-01-30 MED ORDER — LEVOTHYROXINE SODIUM 25 MCG PO TABS: 25.0000 ug | ORAL_TABLET | Freq: Every day | ORAL | 0 refills | Status: DC

## 2019-01-30 MED ORDER — AMLODIPINE BESYLATE 5 MG PO TABS: 5.0000 mg | ORAL_TABLET | Freq: Every day | ORAL | 0 refills | Status: DC

## 2019-02-04 ENCOUNTER — Other Ambulatory Visit: Payer: Self-pay | Admitting: Adult Health

## 2019-02-04 DIAGNOSIS — E039 Hypothyroidism, unspecified: Secondary | ICD-10-CM

## 2019-02-04 DIAGNOSIS — I10 Essential (primary) hypertension: Secondary | ICD-10-CM

## 2019-02-04 MED ORDER — LEVOTHYROXINE SODIUM 25 MCG PO TABS: 25.0000 ug | ORAL_TABLET | Freq: Every day | ORAL | 0 refills | Status: DC

## 2019-02-04 MED ORDER — AMLODIPINE BESYLATE 5 MG PO TABS: 5.0000 mg | ORAL_TABLET | Freq: Every day | ORAL | 0 refills | Status: DC

## 2019-02-04 NOTE — Progress Notes (Signed)
Refilled Amlodipine and Synthroid.

## 2019-02-05 ENCOUNTER — Other Ambulatory Visit: Payer: Self-pay

## 2019-02-05 DIAGNOSIS — I10 Essential (primary) hypertension: Secondary | ICD-10-CM

## 2019-02-05 DIAGNOSIS — E039 Hypothyroidism, unspecified: Secondary | ICD-10-CM

## 2019-02-05 MED ORDER — AMLODIPINE BESYLATE 5 MG PO TABS: 5.0000 mg | ORAL_TABLET | Freq: Every day | ORAL | 0 refills | Status: DC

## 2019-02-05 MED ORDER — LEVOTHYROXINE SODIUM 25 MCG PO TABS: 25.0000 ug | ORAL_TABLET | Freq: Every day | ORAL | 1 refills | Status: DC

## 2019-02-05 MED ORDER — LEVOTHYROXINE SODIUM 25 MCG PO TABS: 25.0000 ug | ORAL_TABLET | Freq: Every day | ORAL | 0 refills | Status: DC

## 2019-02-05 MED ORDER — AMLODIPINE BESYLATE 5 MG PO TABS: 5.0000 mg | ORAL_TABLET | Freq: Every day | ORAL | 1 refills | Status: DC

## 2019-03-15 DIAGNOSIS — Z1231 Encounter for screening mammogram for malignant neoplasm of breast: Secondary | ICD-10-CM | POA: Diagnosis not present

## 2019-06-21 ENCOUNTER — Other Ambulatory Visit: Payer: Self-pay

## 2019-06-21 DIAGNOSIS — E039 Hypothyroidism, unspecified: Secondary | ICD-10-CM

## 2019-06-21 DIAGNOSIS — I10 Essential (primary) hypertension: Secondary | ICD-10-CM

## 2019-06-21 MED ORDER — LEVOTHYROXINE SODIUM 25 MCG PO TABS: 25.0000 ug | ORAL_TABLET | Freq: Every day | ORAL | 1 refills | Status: DC

## 2019-06-21 MED ORDER — AMLODIPINE BESYLATE 5 MG PO TABS: 5.0000 mg | ORAL_TABLET | Freq: Every day | ORAL | 0 refills | Status: DC

## 2019-06-27 ENCOUNTER — Telehealth: Payer: Self-pay

## 2019-06-27 NOTE — Telephone Encounter (Signed)
PT CALLED TO NOTIFY us THAT SHE RECEIVED HER FIRST ROUND FOR HER COVID VACCINE 06/26/2019 AND WILL RECEIVE HER SECOND VACCINE ON 07/24/2019

## 2019-07-02 ENCOUNTER — Telehealth: Payer: Self-pay

## 2019-07-03 NOTE — Telephone Encounter (Signed)
Pt was notified.  

## 2019-07-03 NOTE — Telephone Encounter (Signed)
It was slightly underactive. She is on medication to help boost it a little bit. Most recent thyroid panel was normal. This could definitely cause some issue with weight gain.

## 2019-07-03 NOTE — Telephone Encounter (Signed)
I think we would have to have visit to figure out if bladder were under or overactive bladder. However, this does not usually cause weight gain.

## 2019-07-03 NOTE — Telephone Encounter (Signed)
Im sorry it was not bladder is was thyroid, overactive or underactive thyroid.

## 2019-08-27 DIAGNOSIS — H524 Presbyopia: Secondary | ICD-10-CM | POA: Diagnosis not present

## 2019-08-27 DIAGNOSIS — H2513 Age-related nuclear cataract, bilateral: Secondary | ICD-10-CM | POA: Diagnosis not present

## 2019-09-10 ENCOUNTER — Other Ambulatory Visit: Payer: Self-pay

## 2019-09-10 DIAGNOSIS — I10 Essential (primary) hypertension: Secondary | ICD-10-CM

## 2019-09-10 MED ORDER — AMLODIPINE BESYLATE 5 MG PO TABS: 5.0000 mg | ORAL_TABLET | Freq: Every day | ORAL | 1 refills | Status: DC

## 2019-11-15 ENCOUNTER — Telehealth: Payer: Self-pay

## 2019-11-15 NOTE — Telephone Encounter (Signed)
Lmom to confirm and screen for 11-19-19 ov.

## 2019-11-18 ENCOUNTER — Telehealth: Payer: Self-pay

## 2019-11-18 NOTE — Telephone Encounter (Signed)
Confirmed and screened for 11-19-19 ov. °

## 2019-11-19 ENCOUNTER — Encounter: Payer: Self-pay | Admitting: Nurse Practitioner

## 2019-11-19 ENCOUNTER — Other Ambulatory Visit: Payer: Self-pay | Admitting: Nurse Practitioner

## 2019-11-19 ENCOUNTER — Other Ambulatory Visit: Payer: Self-pay

## 2019-11-19 ENCOUNTER — Ambulatory Visit (INDEPENDENT_AMBULATORY_CARE_PROVIDER_SITE_OTHER): Payer: Medicare Other | Admitting: Nurse Practitioner

## 2019-11-19 VITALS — BP 131/72 | HR 79 | Temp 97.4°F | Resp 16 | Ht 64.0 in | Wt 151.0 lb

## 2019-11-19 DIAGNOSIS — R300 Dysuria: Secondary | ICD-10-CM

## 2019-11-19 DIAGNOSIS — M545 Low back pain, unspecified: Secondary | ICD-10-CM

## 2019-11-19 DIAGNOSIS — E039 Hypothyroidism, unspecified: Secondary | ICD-10-CM

## 2019-11-19 DIAGNOSIS — Z1159 Encounter for screening for other viral diseases: Secondary | ICD-10-CM | POA: Diagnosis not present

## 2019-11-19 DIAGNOSIS — N6310 Unspecified lump in the right breast, unspecified quadrant: Secondary | ICD-10-CM

## 2019-11-19 DIAGNOSIS — Z0001 Encounter for general adult medical examination with abnormal findings: Secondary | ICD-10-CM | POA: Diagnosis not present

## 2019-11-19 DIAGNOSIS — E559 Vitamin D deficiency, unspecified: Secondary | ICD-10-CM | POA: Diagnosis not present

## 2019-11-19 DIAGNOSIS — Z1231 Encounter for screening mammogram for malignant neoplasm of breast: Secondary | ICD-10-CM

## 2019-11-19 DIAGNOSIS — Z13820 Encounter for screening for osteoporosis: Secondary | ICD-10-CM

## 2019-11-19 DIAGNOSIS — I10 Essential (primary) hypertension: Secondary | ICD-10-CM | POA: Diagnosis not present

## 2019-11-19 MED ORDER — TRAMADOL-ACETAMINOPHEN 37.5-325 MG PO TABS: 1.0000 | ORAL_TABLET | ORAL | 2 refills | Status: DC | PRN

## 2019-11-19 MED ORDER — AMLODIPINE BESYLATE 5 MG PO TABS: 5.0000 mg | ORAL_TABLET | Freq: Every day | ORAL | 3 refills | Status: DC

## 2019-11-19 MED ORDER — LEVOTHYROXINE SODIUM 25 MCG PO TABS: 25.0000 ug | ORAL_TABLET | Freq: Every day | ORAL | 3 refills | Status: DC

## 2019-11-19 NOTE — Progress Notes (Signed)
[facility] [place], [place] [address]  Internal MEDICINE  Office Visit Note  Patient Name: Carolyn Reyes  [id number]  [id number]  Date of Service: 11/19/2019   Pt is here for routine health maintenance examination  Chief Complaint  Patient presents with   Medicare Wellness   Hypertension     The patient is here for health maintenance exam. She states that she is feeling well, overall. She does have several bug bites on her legs from being outside and taking care of her yard and garden. She states that she has tried many remedies and nothing really helps with the itching. She knows that bites will heal on their own eventually.  Blood pressure is well managed. She is due to have routine, fasting labs done. She had her last mammogram in 02/2019 and it was negative. Her last bone density test was done 11/2015 and was normal.     Current Medication: Outpatient Encounter Medications as of 11/19/2019  Medication Sig   amLODipine (NORVASC) 5 MG tablet Take 1 tablet (5 mg total) by mouth daily.   aspirin 81 MG chewable tablet Chew 81 mg by mouth daily.   Calcium Carbonate-Vit D-Min (CALCIUM 600+D3 PLUS MINERALS) 600-800 MG-UNIT TABS Take by mouth.   clobetasol cream (TEMOVATE) 0.27 % Apply 1 application topically 2 (two) times daily.   levothyroxine (SYNTHROID) 25 MCG tablet Take 1 tablet (25 mcg total) by mouth daily before breakfast.   traMADol-acetaminophen (ULTRACET) 37.5-325 MG tablet Take 1 tablet by mouth every 4 (four) hours as needed (PRN).   UNABLE TO FIND prn   [DISCONTINUED] amLODipine (NORVASC) 5 MG tablet Take 1 tablet (5 mg total) by mouth daily.   [DISCONTINUED] levothyroxine (SYNTHROID) 25 MCG tablet Take 1 tablet (25 mcg total) by mouth daily before breakfast.   [DISCONTINUED] traMADol-acetaminophen (ULTRACET) 37.5-325 MG tablet Take 1 tablet by mouth every 4 (four) hours as needed (PRN).   No facility-administered encounter  medications on file as of 11/19/2019.    Surgical History: Past Surgical History:  Procedure Laterality Date   BREAST SURGERY Left    FIBROID CYST REMOVED     Medical History: Past Medical History:  Diagnosis Date   Hypertension    Thyroid disease     Family History: Family History  Problem Relation Age of Onset   Diabetes Mother    Hypertension Mother    Hypertension Father       Review of Systems  Constitutional: Negative for activity change, chills, fatigue and unexpected weight change.  HENT: Negative for congestion, postnasal drip, rhinorrhea, sneezing and sore throat.   Respiratory: Negative for cough, chest tightness, shortness of breath and wheezing.   Cardiovascular: Negative for chest pain and palpitations.  Gastrointestinal: Negative for abdominal pain, constipation, diarrhea, nausea and vomiting.  Endocrine: Negative for cold intolerance, heat intolerance, polydipsia and polyuria.  Genitourinary: Negative for dysuria, frequency, pelvic pain and urgency.  Musculoskeletal: Negative for arthralgias, back pain, joint swelling and neck pain.  Skin: Negative for rash.       Multiple itchy bug bites on both legs.   Allergic/Immunologic: Negative for environmental allergies.  Neurological: Negative for dizziness, tremors, numbness and headaches.  Hematological: Negative for adenopathy. Does not bruise/bleed easily.  Psychiatric/Behavioral: Negative for behavioral problems (Depression), sleep disturbance and suicidal ideas. The patient is not nervous/anxious.     Today's Vitals   11/19/19 0910  BP: 131/72  Pulse: 79  Resp: 16  Temp: (!) 97.4 F (36.3 C)  SpO2: 99%  Weight: 151 lb (68.5 kg)  Height: 5\' 4"  (1.626 m)   Body mass index is 25.92 kg/m.  Physical Exam Vitals and nursing note reviewed.  Constitutional:      General: She is not in acute distress.    Appearance: Normal appearance. She is well-developed. She is not diaphoretic.  HENT:      Head: Normocephalic and atraumatic.     Mouth/Throat:     Pharynx: No oropharyngeal exudate.  Eyes:     Pupils: Pupils are equal, round, and reactive to light.  Neck:     Thyroid: No thyromegaly.     Vascular: No carotid bruit or JVD.     Trachea: No tracheal deviation.  Cardiovascular:     Rate and Rhythm: Normal rate and regular rhythm.     Pulses: Normal pulses.     Heart sounds: Normal heart sounds. No murmur heard.  No friction rub. No gallop.   Pulmonary:     Effort: Pulmonary effort is normal. No respiratory distress.     Breath sounds: Normal breath sounds. No wheezing or rales.  Chest:     Chest wall: No tenderness.     Breasts:        Right: Mass present. No swelling, bleeding, inverted nipple, nipple discharge, skin change or tenderness.        Left: Normal. No swelling, bleeding, inverted nipple, mass, nipple discharge, skin change or tenderness.       Comments:   Abdominal:     General: Bowel sounds are normal.     Palpations: Abdomen is soft.     Tenderness: There is no abdominal tenderness.  Musculoskeletal:        General: Normal range of motion.     Cervical back: Normal range of motion and neck supple.  Lymphadenopathy:     Cervical: No cervical adenopathy.  Skin:    General: Skin is warm and dry.     Comments: Toenail on right great toe is abnormally thick and is pulling away from the nail bed. There is no inflammation or evidence of acute infection. No tenderness with palpatin.   Neurological:     Mental Status: She is alert and oriented to person, place, and time.     Cranial Nerves: No cranial nerve deficit.  Psychiatric:        Behavior: Behavior normal.        Thought Content: Thought content normal.        Judgment: Judgment normal.     Assessment/Plan: 1. Encounter for general adult medical examination with abnormal findings Annual health maintenance exam today. Routine, fasting labs ordered.   2. Essential hypertension Stable. Continue bp  medication as prescribed  - amLODipine (NORVASC) 5 MG tablet; Take 1 tablet (5 mg total) by mouth daily.  Dispense: 90 tablet; Refill: 3  3. Breast mass, right Will get ultrasound of area of concern for futher evaluation. Refer as indicated.  - US BREAST LTD UNI RIGHT INC AXILLA; Future  4. Acquired hypothyroidism Check thyroid panel. Adjust levothyroxine as indicated.  - levothyroxine (SYNTHROID) 25 MCG tablet; Take 1 tablet (25 mcg total) by mouth daily before breakfast.  Dispense: 90 tablet; Refill: 3  5. Low back pain potentially associated with radiculopathy Intermittently takes tramadol/APAP 37.5/325mg  tablets for severe back pain. Most recent prescription given 10/2018. Single prescription for #30 talbets was sent to her pharmacy.  - traMADol-acetaminophen (ULTRACET) 37.5-325 MG tablet; Take 1 tablet by mouth every 4 (four) hours as  needed (PRN).  Dispense: 30 tablet; Refill: 2  6. Encounter for screening mammogram for malignant neoplasm of breast Screening mammogram should be scheduled for 02/2020 - MM DIGITAL SCREENING BILATERAL; Future  7. Screening for osteoporosis Bone density should be scheduled for 02/2020 - DG Bone Density; Future  8. Dysuria - Urinalysis, Routine w reflex microscopic  General Counseling: Merranda verbalizes understanding of the findings of todays visit and agrees with plan of treatment. I have discussed any further diagnostic evaluation that may be needed or ordered today. We also reviewed her medications today. she has been encouraged to call the office with any questions or concerns that should arise related to todays visit.    Counseling:  This patient was seen by [place] with Dr Lavera Guise as a part of collaborative care agreement  Orders Placed This Encounter  Procedures   DG Bone Density   MM DIGITAL SCREENING BILATERAL   US BREAST LTD UNI RIGHT INC AXILLA   Urinalysis, Routine w reflex microscopic    Meds  ordered this encounter  Medications   amLODipine (NORVASC) 5 MG tablet    Sig: Take 1 tablet (5 mg total) by mouth daily.    Dispense:  90 tablet    Refill:  3    Pt need follow up appt    Order Specific Question:   Supervising Provider    Answer:   Lavera Guise [[id number]]   levothyroxine (SYNTHROID) 25 MCG tablet    Sig: Take 1 tablet (25 mcg total) by mouth daily before breakfast.    Dispense:  90 tablet    Refill:  3    Order Specific Question:   Supervising Provider    Answer:   Lavera Guise [[id number]]   traMADol-acetaminophen (ULTRACET) 37.5-325 MG tablet    Sig: Take 1 tablet by mouth every 4 (four) hours as needed (PRN).    Dispense:  30 tablet    Refill:  2    Order Specific Question:   Supervising Provider    Answer:   Lavera Guise [[id number]]    Total time spent: 27 Minutes  Time spent includes review of chart, medications, test results, and follow up plan with the patient.     Lavera Guise, MD  Internal Medicine

## 2019-11-20 LAB — URINALYSIS, ROUTINE W REFLEX MICROSCOPIC
Bilirubin, UA: NEGATIVE
Glucose, UA: NEGATIVE
Ketones, UA: NEGATIVE
Leukocytes,UA: NEGATIVE
Nitrite, UA: NEGATIVE
Protein,UA: NEGATIVE
RBC, UA: NEGATIVE
Specific Gravity, UA: 1.013 (ref 1.005–1.030)
Urobilinogen, Ur: 0.2 mg/dL (ref 0.2–1.0)
pH, UA: 7.5 (ref 5.0–7.5)

## 2019-11-20 LAB — LIPID PANEL WITH LDL/HDL RATIO
Cholesterol, Total: 214 mg/dL — ABNORMAL HIGH (ref 100–199)
HDL: 65 mg/dL (ref >39)
LDL Chol Calc (NIH): 126 mg/dL — ABNORMAL HIGH (ref 0–99)
LDL/HDL Ratio: 1.9 ratio (ref 0.0–3.2)
Triglycerides: 131 mg/dL (ref 0–149)
VLDL Cholesterol Cal: 23 mg/dL (ref 5–40)

## 2019-11-20 LAB — COMPREHENSIVE METABOLIC PANEL
ALT: 20 IU/L (ref 0–32)
AST: 25 IU/L (ref 0–40)
Albumin/Globulin Ratio: 1.6 (ref 1.2–2.2)
Albumin: 4.4 g/dL (ref 3.7–4.7)
Alkaline Phosphatase: 121 IU/L (ref 48–121)
BUN/Creatinine Ratio: 17 (ref 12–28)
BUN: 13 mg/dL (ref 8–27)
Bilirubin Total: 0.4 mg/dL (ref 0.0–1.2)
CO2: 23 mmol/L (ref 20–29)
Calcium: 10.3 mg/dL (ref 8.7–10.3)
Chloride: 103 mmol/L (ref 96–106)
Creatinine, Ser: 0.78 mg/dL (ref 0.57–1.00)
GFR calc Af Amer: 87 mL/min/{1.73_m2} (ref >59)
GFR calc non Af Amer: 76 mL/min/{1.73_m2} (ref >59)
Globulin, Total: 2.8 g/dL (ref 1.5–4.5)
Glucose: 101 mg/dL — ABNORMAL HIGH (ref 65–99)
Potassium: 4.1 mmol/L (ref 3.5–5.2)
Sodium: 139 mmol/L (ref 134–144)
Total Protein: 7.2 g/dL (ref 6.0–8.5)

## 2019-11-20 LAB — CBC
Hematocrit: 44.9 % (ref 34.0–46.6)
Hemoglobin: 14.9 g/dL (ref 11.1–15.9)
MCH: 28 pg (ref 26.6–33.0)
MCHC: 33.2 g/dL (ref 31.5–35.7)
MCV: 84 fL (ref 79–97)
Platelets: 251 10*3/uL (ref 150–450)
RBC: 5.32 x10E6/uL — ABNORMAL HIGH (ref 3.77–5.28)
RDW: 12.7 % (ref 11.7–15.4)
WBC: 4.2 10*3/uL (ref 3.4–10.8)

## 2019-11-20 LAB — TSH: TSH: 0.965 u[IU]/mL (ref 0.450–4.500)

## 2019-11-20 LAB — HCV COMMENT:

## 2019-11-20 LAB — VITAMIN D 25 HYDROXY (VIT D DEFICIENCY, FRACTURES): Vit D, 25-Hydroxy: 36.5 ng/mL (ref 30.0–100.0)

## 2019-11-20 LAB — HEPATITIS C ANTIBODY (REFLEX): HCV Ab: <0.1 s/co ratio (ref 0.0–0.9)

## 2019-11-20 LAB — T4, FREE: Free T4: 1.22 ng/dL (ref 0.82–1.77)

## 2019-11-27 NOTE — Progress Notes (Signed)
Overall, her labs are good. No changes to medication.

## 2019-11-29 DIAGNOSIS — R922 Inconclusive mammogram: Secondary | ICD-10-CM | POA: Diagnosis not present

## 2019-11-29 DIAGNOSIS — N6310 Unspecified lump in the right breast, unspecified quadrant: Secondary | ICD-10-CM | POA: Diagnosis not present

## 2019-12-17 DIAGNOSIS — C50911 Malignant neoplasm of unspecified site of right female breast: Secondary | ICD-10-CM | POA: Diagnosis not present

## 2019-12-17 DIAGNOSIS — R928 Other abnormal and inconclusive findings on diagnostic imaging of breast: Secondary | ICD-10-CM | POA: Diagnosis not present

## 2019-12-17 DIAGNOSIS — N6310 Unspecified lump in the right breast, unspecified quadrant: Secondary | ICD-10-CM | POA: Diagnosis not present

## 2019-12-17 DIAGNOSIS — Z803 Family history of malignant neoplasm of breast: Secondary | ICD-10-CM | POA: Diagnosis not present

## 2019-12-17 DIAGNOSIS — Z7982 Long term (current) use of aspirin: Secondary | ICD-10-CM | POA: Diagnosis not present

## 2019-12-31 DIAGNOSIS — C50911 Malignant neoplasm of unspecified site of right female breast: Secondary | ICD-10-CM | POA: Diagnosis not present

## 2020-01-03 DIAGNOSIS — C50911 Malignant neoplasm of unspecified site of right female breast: Secondary | ICD-10-CM | POA: Diagnosis not present

## 2020-01-03 DIAGNOSIS — Z803 Family history of malignant neoplasm of breast: Secondary | ICD-10-CM | POA: Diagnosis not present

## 2020-01-03 DIAGNOSIS — C50311 Malignant neoplasm of lower-inner quadrant of right female breast: Secondary | ICD-10-CM | POA: Diagnosis not present

## 2020-01-03 DIAGNOSIS — C50912 Malignant neoplasm of unspecified site of left female breast: Secondary | ICD-10-CM | POA: Diagnosis not present

## 2020-01-03 DIAGNOSIS — N6321 Unspecified lump in the left breast, upper outer quadrant: Secondary | ICD-10-CM | POA: Diagnosis not present

## 2020-01-03 DIAGNOSIS — Z853 Personal history of malignant neoplasm of breast: Secondary | ICD-10-CM | POA: Diagnosis not present

## 2020-01-03 DIAGNOSIS — R922 Inconclusive mammogram: Secondary | ICD-10-CM | POA: Diagnosis not present

## 2020-01-07 DIAGNOSIS — N6022 Fibroadenosis of left breast: Secondary | ICD-10-CM | POA: Diagnosis not present

## 2020-01-07 DIAGNOSIS — R928 Other abnormal and inconclusive findings on diagnostic imaging of breast: Secondary | ICD-10-CM | POA: Diagnosis not present

## 2020-01-07 DIAGNOSIS — R921 Mammographic calcification found on diagnostic imaging of breast: Secondary | ICD-10-CM | POA: Diagnosis not present

## 2020-01-10 DIAGNOSIS — Z170 Estrogen receptor positive status [ER+]: Secondary | ICD-10-CM | POA: Diagnosis not present

## 2020-01-10 DIAGNOSIS — Z7982 Long term (current) use of aspirin: Secondary | ICD-10-CM | POA: Diagnosis not present

## 2020-01-10 DIAGNOSIS — C773 Secondary and unspecified malignant neoplasm of axilla and upper limb lymph nodes: Secondary | ICD-10-CM | POA: Diagnosis not present

## 2020-01-10 DIAGNOSIS — C50311 Malignant neoplasm of lower-inner quadrant of right female breast: Secondary | ICD-10-CM | POA: Diagnosis not present

## 2020-01-10 DIAGNOSIS — Z79899 Other long term (current) drug therapy: Secondary | ICD-10-CM | POA: Diagnosis not present

## 2020-01-10 DIAGNOSIS — Z780 Asymptomatic menopausal state: Secondary | ICD-10-CM | POA: Diagnosis not present

## 2020-01-10 DIAGNOSIS — C50911 Malignant neoplasm of unspecified site of right female breast: Secondary | ICD-10-CM | POA: Diagnosis not present

## 2020-01-21 DIAGNOSIS — C50911 Malignant neoplasm of unspecified site of right female breast: Secondary | ICD-10-CM | POA: Diagnosis not present

## 2020-02-10 DIAGNOSIS — Z7982 Long term (current) use of aspirin: Secondary | ICD-10-CM | POA: Diagnosis not present

## 2020-02-10 DIAGNOSIS — Z9011 Acquired absence of right breast and nipple: Secondary | ICD-10-CM | POA: Diagnosis not present

## 2020-02-10 DIAGNOSIS — Z9289 Personal history of other medical treatment: Secondary | ICD-10-CM | POA: Diagnosis not present

## 2020-02-10 DIAGNOSIS — I10 Essential (primary) hypertension: Secondary | ICD-10-CM | POA: Diagnosis not present

## 2020-02-10 DIAGNOSIS — E039 Hypothyroidism, unspecified: Secondary | ICD-10-CM | POA: Diagnosis not present

## 2020-02-10 DIAGNOSIS — Z23 Encounter for immunization: Secondary | ICD-10-CM | POA: Diagnosis not present

## 2020-02-10 DIAGNOSIS — Z803 Family history of malignant neoplasm of breast: Secondary | ICD-10-CM | POA: Diagnosis not present

## 2020-02-10 DIAGNOSIS — C50911 Malignant neoplasm of unspecified site of right female breast: Secondary | ICD-10-CM | POA: Diagnosis not present

## 2020-02-10 DIAGNOSIS — Z780 Asymptomatic menopausal state: Secondary | ICD-10-CM | POA: Diagnosis not present

## 2020-03-06 DIAGNOSIS — Z170 Estrogen receptor positive status [ER+]: Secondary | ICD-10-CM | POA: Diagnosis not present

## 2020-03-06 DIAGNOSIS — C50811 Malignant neoplasm of overlapping sites of right female breast: Secondary | ICD-10-CM | POA: Diagnosis not present

## 2020-03-10 DIAGNOSIS — Z853 Personal history of malignant neoplasm of breast: Secondary | ICD-10-CM | POA: Diagnosis not present

## 2020-03-10 DIAGNOSIS — Z170 Estrogen receptor positive status [ER+]: Secondary | ICD-10-CM | POA: Diagnosis not present

## 2020-03-10 DIAGNOSIS — C50811 Malignant neoplasm of overlapping sites of right female breast: Secondary | ICD-10-CM | POA: Diagnosis not present

## 2020-03-10 DIAGNOSIS — Z08 Encounter for follow-up examination after completed treatment for malignant neoplasm: Secondary | ICD-10-CM | POA: Diagnosis not present

## 2020-03-10 DIAGNOSIS — Z5111 Encounter for antineoplastic chemotherapy: Secondary | ICD-10-CM | POA: Diagnosis not present

## 2020-03-11 DIAGNOSIS — M8588 Other specified disorders of bone density and structure, other site: Secondary | ICD-10-CM | POA: Diagnosis not present

## 2020-03-11 DIAGNOSIS — Z780 Asymptomatic menopausal state: Secondary | ICD-10-CM | POA: Diagnosis not present

## 2020-03-17 DIAGNOSIS — Z170 Estrogen receptor positive status [ER+]: Secondary | ICD-10-CM | POA: Diagnosis not present

## 2020-03-17 DIAGNOSIS — C50811 Malignant neoplasm of overlapping sites of right female breast: Secondary | ICD-10-CM | POA: Diagnosis not present

## 2020-03-17 DIAGNOSIS — Z5111 Encounter for antineoplastic chemotherapy: Secondary | ICD-10-CM | POA: Diagnosis not present

## 2020-03-18 DIAGNOSIS — C50811 Malignant neoplasm of overlapping sites of right female breast: Secondary | ICD-10-CM | POA: Diagnosis not present

## 2020-03-18 DIAGNOSIS — Z170 Estrogen receptor positive status [ER+]: Secondary | ICD-10-CM | POA: Diagnosis not present

## 2020-03-18 DIAGNOSIS — Z5111 Encounter for antineoplastic chemotherapy: Secondary | ICD-10-CM | POA: Diagnosis not present

## 2020-03-23 DIAGNOSIS — I10 Essential (primary) hypertension: Secondary | ICD-10-CM | POA: Diagnosis not present

## 2020-03-23 DIAGNOSIS — E039 Hypothyroidism, unspecified: Secondary | ICD-10-CM | POA: Diagnosis not present

## 2020-03-23 DIAGNOSIS — Z9221 Personal history of antineoplastic chemotherapy: Secondary | ICD-10-CM | POA: Diagnosis not present

## 2020-03-23 DIAGNOSIS — R110 Nausea: Secondary | ICD-10-CM | POA: Diagnosis not present

## 2020-03-23 DIAGNOSIS — Z79899 Other long term (current) drug therapy: Secondary | ICD-10-CM | POA: Diagnosis not present

## 2020-03-23 DIAGNOSIS — M549 Dorsalgia, unspecified: Secondary | ICD-10-CM | POA: Diagnosis not present

## 2020-03-23 DIAGNOSIS — Z7982 Long term (current) use of aspirin: Secondary | ICD-10-CM | POA: Diagnosis not present

## 2020-03-23 DIAGNOSIS — M545 Low back pain, unspecified: Secondary | ICD-10-CM | POA: Diagnosis not present

## 2020-03-23 DIAGNOSIS — C50811 Malignant neoplasm of overlapping sites of right female breast: Secondary | ICD-10-CM | POA: Diagnosis not present

## 2020-03-23 DIAGNOSIS — Z9011 Acquired absence of right breast and nipple: Secondary | ICD-10-CM | POA: Diagnosis not present

## 2020-03-31 DIAGNOSIS — M791 Myalgia, unspecified site: Secondary | ICD-10-CM | POA: Diagnosis not present

## 2020-03-31 DIAGNOSIS — Z170 Estrogen receptor positive status [ER+]: Secondary | ICD-10-CM | POA: Diagnosis not present

## 2020-03-31 DIAGNOSIS — Z5111 Encounter for antineoplastic chemotherapy: Secondary | ICD-10-CM | POA: Diagnosis not present

## 2020-03-31 DIAGNOSIS — M62838 Other muscle spasm: Secondary | ICD-10-CM | POA: Diagnosis not present

## 2020-03-31 DIAGNOSIS — Z08 Encounter for follow-up examination after completed treatment for malignant neoplasm: Secondary | ICD-10-CM | POA: Diagnosis not present

## 2020-03-31 DIAGNOSIS — C50811 Malignant neoplasm of overlapping sites of right female breast: Secondary | ICD-10-CM | POA: Diagnosis not present

## 2020-03-31 DIAGNOSIS — M2550 Pain in unspecified joint: Secondary | ICD-10-CM | POA: Diagnosis not present

## 2020-03-31 DIAGNOSIS — Z853 Personal history of malignant neoplasm of breast: Secondary | ICD-10-CM | POA: Diagnosis not present

## 2020-04-07 DIAGNOSIS — Z5111 Encounter for antineoplastic chemotherapy: Secondary | ICD-10-CM | POA: Diagnosis not present

## 2020-04-07 DIAGNOSIS — C50811 Malignant neoplasm of overlapping sites of right female breast: Secondary | ICD-10-CM | POA: Diagnosis not present

## 2020-04-07 DIAGNOSIS — Z170 Estrogen receptor positive status [ER+]: Secondary | ICD-10-CM | POA: Diagnosis not present

## 2020-04-09 DIAGNOSIS — Z170 Estrogen receptor positive status [ER+]: Secondary | ICD-10-CM | POA: Diagnosis not present

## 2020-04-09 DIAGNOSIS — C50811 Malignant neoplasm of overlapping sites of right female breast: Secondary | ICD-10-CM | POA: Diagnosis not present

## 2020-04-28 DIAGNOSIS — C50911 Malignant neoplasm of unspecified site of right female breast: Secondary | ICD-10-CM | POA: Diagnosis not present

## 2020-04-28 DIAGNOSIS — R5383 Other fatigue: Secondary | ICD-10-CM | POA: Diagnosis not present

## 2020-04-28 DIAGNOSIS — Z170 Estrogen receptor positive status [ER+]: Secondary | ICD-10-CM | POA: Diagnosis not present

## 2020-04-28 DIAGNOSIS — R432 Parageusia: Secondary | ICD-10-CM | POA: Diagnosis not present

## 2020-04-28 DIAGNOSIS — C50811 Malignant neoplasm of overlapping sites of right female breast: Secondary | ICD-10-CM | POA: Diagnosis not present

## 2020-04-28 DIAGNOSIS — Z5111 Encounter for antineoplastic chemotherapy: Secondary | ICD-10-CM | POA: Diagnosis not present

## 2020-04-30 DIAGNOSIS — C50811 Malignant neoplasm of overlapping sites of right female breast: Secondary | ICD-10-CM | POA: Diagnosis not present

## 2020-04-30 DIAGNOSIS — Z170 Estrogen receptor positive status [ER+]: Secondary | ICD-10-CM | POA: Diagnosis not present

## 2020-05-19 DIAGNOSIS — Z5111 Encounter for antineoplastic chemotherapy: Secondary | ICD-10-CM | POA: Diagnosis not present

## 2020-05-19 DIAGNOSIS — Z170 Estrogen receptor positive status [ER+]: Secondary | ICD-10-CM | POA: Diagnosis not present

## 2020-05-19 DIAGNOSIS — C50811 Malignant neoplasm of overlapping sites of right female breast: Secondary | ICD-10-CM | POA: Diagnosis not present

## 2020-05-19 DIAGNOSIS — C50911 Malignant neoplasm of unspecified site of right female breast: Secondary | ICD-10-CM | POA: Diagnosis not present

## 2020-05-19 DIAGNOSIS — R5383 Other fatigue: Secondary | ICD-10-CM | POA: Diagnosis not present

## 2020-05-19 DIAGNOSIS — K5900 Constipation, unspecified: Secondary | ICD-10-CM | POA: Diagnosis not present

## 2020-05-20 DIAGNOSIS — C50811 Malignant neoplasm of overlapping sites of right female breast: Secondary | ICD-10-CM | POA: Diagnosis not present

## 2020-05-20 DIAGNOSIS — Z170 Estrogen receptor positive status [ER+]: Secondary | ICD-10-CM | POA: Diagnosis not present

## 2020-05-27 DIAGNOSIS — C50811 Malignant neoplasm of overlapping sites of right female breast: Secondary | ICD-10-CM | POA: Diagnosis not present

## 2020-05-27 DIAGNOSIS — R5383 Other fatigue: Secondary | ICD-10-CM | POA: Diagnosis not present

## 2020-05-27 DIAGNOSIS — I890 Lymphedema, not elsewhere classified: Secondary | ICD-10-CM | POA: Diagnosis not present

## 2020-05-27 DIAGNOSIS — R609 Edema, unspecified: Secondary | ICD-10-CM | POA: Diagnosis not present

## 2020-05-27 DIAGNOSIS — Z170 Estrogen receptor positive status [ER+]: Secondary | ICD-10-CM | POA: Diagnosis not present

## 2020-05-27 DIAGNOSIS — L309 Dermatitis, unspecified: Secondary | ICD-10-CM | POA: Diagnosis not present

## 2020-06-05 DIAGNOSIS — C50311 Malignant neoplasm of lower-inner quadrant of right female breast: Secondary | ICD-10-CM | POA: Diagnosis not present

## 2020-06-05 DIAGNOSIS — Z170 Estrogen receptor positive status [ER+]: Secondary | ICD-10-CM | POA: Diagnosis not present

## 2020-06-05 DIAGNOSIS — C50811 Malignant neoplasm of overlapping sites of right female breast: Secondary | ICD-10-CM | POA: Diagnosis not present

## 2020-06-05 DIAGNOSIS — L309 Dermatitis, unspecified: Secondary | ICD-10-CM | POA: Diagnosis not present

## 2020-06-05 DIAGNOSIS — I890 Lymphedema, not elsewhere classified: Secondary | ICD-10-CM | POA: Diagnosis not present

## 2020-06-05 DIAGNOSIS — R609 Edema, unspecified: Secondary | ICD-10-CM | POA: Diagnosis not present

## 2020-06-05 DIAGNOSIS — R5383 Other fatigue: Secondary | ICD-10-CM | POA: Diagnosis not present

## 2020-06-17 DIAGNOSIS — I890 Lymphedema, not elsewhere classified: Secondary | ICD-10-CM | POA: Diagnosis not present

## 2020-06-17 DIAGNOSIS — L309 Dermatitis, unspecified: Secondary | ICD-10-CM | POA: Diagnosis not present

## 2020-06-17 DIAGNOSIS — C50311 Malignant neoplasm of lower-inner quadrant of right female breast: Secondary | ICD-10-CM | POA: Diagnosis not present

## 2020-06-17 DIAGNOSIS — R5383 Other fatigue: Secondary | ICD-10-CM | POA: Diagnosis not present

## 2020-06-17 DIAGNOSIS — C50811 Malignant neoplasm of overlapping sites of right female breast: Secondary | ICD-10-CM | POA: Diagnosis not present

## 2020-06-17 DIAGNOSIS — Z170 Estrogen receptor positive status [ER+]: Secondary | ICD-10-CM | POA: Diagnosis not present

## 2020-06-17 DIAGNOSIS — R609 Edema, unspecified: Secondary | ICD-10-CM | POA: Diagnosis not present

## 2020-06-23 DIAGNOSIS — C50311 Malignant neoplasm of lower-inner quadrant of right female breast: Secondary | ICD-10-CM | POA: Diagnosis not present

## 2020-06-23 DIAGNOSIS — R609 Edema, unspecified: Secondary | ICD-10-CM | POA: Diagnosis not present

## 2020-06-23 DIAGNOSIS — Z170 Estrogen receptor positive status [ER+]: Secondary | ICD-10-CM | POA: Diagnosis not present

## 2020-06-23 DIAGNOSIS — L309 Dermatitis, unspecified: Secondary | ICD-10-CM | POA: Diagnosis not present

## 2020-06-23 DIAGNOSIS — C50811 Malignant neoplasm of overlapping sites of right female breast: Secondary | ICD-10-CM | POA: Diagnosis not present

## 2020-06-23 DIAGNOSIS — R5383 Other fatigue: Secondary | ICD-10-CM | POA: Diagnosis not present

## 2020-06-23 DIAGNOSIS — I890 Lymphedema, not elsewhere classified: Secondary | ICD-10-CM | POA: Diagnosis not present

## 2020-06-24 DIAGNOSIS — L309 Dermatitis, unspecified: Secondary | ICD-10-CM | POA: Diagnosis not present

## 2020-06-24 DIAGNOSIS — R5383 Other fatigue: Secondary | ICD-10-CM | POA: Diagnosis not present

## 2020-06-24 DIAGNOSIS — C50311 Malignant neoplasm of lower-inner quadrant of right female breast: Secondary | ICD-10-CM | POA: Diagnosis not present

## 2020-06-24 DIAGNOSIS — C50811 Malignant neoplasm of overlapping sites of right female breast: Secondary | ICD-10-CM | POA: Diagnosis not present

## 2020-06-24 DIAGNOSIS — I890 Lymphedema, not elsewhere classified: Secondary | ICD-10-CM | POA: Diagnosis not present

## 2020-06-24 DIAGNOSIS — Z170 Estrogen receptor positive status [ER+]: Secondary | ICD-10-CM | POA: Diagnosis not present

## 2020-06-24 DIAGNOSIS — R609 Edema, unspecified: Secondary | ICD-10-CM | POA: Diagnosis not present

## 2020-06-25 DIAGNOSIS — R5383 Other fatigue: Secondary | ICD-10-CM | POA: Diagnosis not present

## 2020-06-25 DIAGNOSIS — C50811 Malignant neoplasm of overlapping sites of right female breast: Secondary | ICD-10-CM | POA: Diagnosis not present

## 2020-06-25 DIAGNOSIS — I890 Lymphedema, not elsewhere classified: Secondary | ICD-10-CM | POA: Diagnosis not present

## 2020-06-25 DIAGNOSIS — R609 Edema, unspecified: Secondary | ICD-10-CM | POA: Diagnosis not present

## 2020-06-25 DIAGNOSIS — L309 Dermatitis, unspecified: Secondary | ICD-10-CM | POA: Diagnosis not present

## 2020-06-25 DIAGNOSIS — C50311 Malignant neoplasm of lower-inner quadrant of right female breast: Secondary | ICD-10-CM | POA: Diagnosis not present

## 2020-06-25 DIAGNOSIS — Z170 Estrogen receptor positive status [ER+]: Secondary | ICD-10-CM | POA: Diagnosis not present

## 2020-06-26 DIAGNOSIS — Z170 Estrogen receptor positive status [ER+]: Secondary | ICD-10-CM | POA: Diagnosis not present

## 2020-06-26 DIAGNOSIS — R609 Edema, unspecified: Secondary | ICD-10-CM | POA: Diagnosis not present

## 2020-06-26 DIAGNOSIS — L309 Dermatitis, unspecified: Secondary | ICD-10-CM | POA: Diagnosis not present

## 2020-06-26 DIAGNOSIS — I890 Lymphedema, not elsewhere classified: Secondary | ICD-10-CM | POA: Diagnosis not present

## 2020-06-26 DIAGNOSIS — C50811 Malignant neoplasm of overlapping sites of right female breast: Secondary | ICD-10-CM | POA: Diagnosis not present

## 2020-06-26 DIAGNOSIS — C50311 Malignant neoplasm of lower-inner quadrant of right female breast: Secondary | ICD-10-CM | POA: Diagnosis not present

## 2020-06-26 DIAGNOSIS — R5383 Other fatigue: Secondary | ICD-10-CM | POA: Diagnosis not present

## 2020-06-29 DIAGNOSIS — C50811 Malignant neoplasm of overlapping sites of right female breast: Secondary | ICD-10-CM | POA: Diagnosis not present

## 2020-06-29 DIAGNOSIS — C50311 Malignant neoplasm of lower-inner quadrant of right female breast: Secondary | ICD-10-CM | POA: Diagnosis not present

## 2020-06-29 DIAGNOSIS — Z170 Estrogen receptor positive status [ER+]: Secondary | ICD-10-CM | POA: Diagnosis not present

## 2020-06-29 DIAGNOSIS — R609 Edema, unspecified: Secondary | ICD-10-CM | POA: Diagnosis not present

## 2020-06-29 DIAGNOSIS — L309 Dermatitis, unspecified: Secondary | ICD-10-CM | POA: Diagnosis not present

## 2020-06-29 DIAGNOSIS — R5383 Other fatigue: Secondary | ICD-10-CM | POA: Diagnosis not present

## 2020-06-29 DIAGNOSIS — I890 Lymphedema, not elsewhere classified: Secondary | ICD-10-CM | POA: Diagnosis not present

## 2020-06-30 DIAGNOSIS — C50811 Malignant neoplasm of overlapping sites of right female breast: Secondary | ICD-10-CM | POA: Diagnosis not present

## 2020-06-30 DIAGNOSIS — I890 Lymphedema, not elsewhere classified: Secondary | ICD-10-CM | POA: Diagnosis not present

## 2020-06-30 DIAGNOSIS — R5383 Other fatigue: Secondary | ICD-10-CM | POA: Diagnosis not present

## 2020-06-30 DIAGNOSIS — L309 Dermatitis, unspecified: Secondary | ICD-10-CM | POA: Diagnosis not present

## 2020-06-30 DIAGNOSIS — Z170 Estrogen receptor positive status [ER+]: Secondary | ICD-10-CM | POA: Diagnosis not present

## 2020-06-30 DIAGNOSIS — R609 Edema, unspecified: Secondary | ICD-10-CM | POA: Diagnosis not present

## 2020-06-30 DIAGNOSIS — C50311 Malignant neoplasm of lower-inner quadrant of right female breast: Secondary | ICD-10-CM | POA: Diagnosis not present

## 2020-07-01 DIAGNOSIS — I890 Lymphedema, not elsewhere classified: Secondary | ICD-10-CM | POA: Diagnosis not present

## 2020-07-01 DIAGNOSIS — R609 Edema, unspecified: Secondary | ICD-10-CM | POA: Diagnosis not present

## 2020-07-01 DIAGNOSIS — Z170 Estrogen receptor positive status [ER+]: Secondary | ICD-10-CM | POA: Diagnosis not present

## 2020-07-01 DIAGNOSIS — C50811 Malignant neoplasm of overlapping sites of right female breast: Secondary | ICD-10-CM | POA: Diagnosis not present

## 2020-07-01 DIAGNOSIS — C50311 Malignant neoplasm of lower-inner quadrant of right female breast: Secondary | ICD-10-CM | POA: Diagnosis not present

## 2020-07-01 DIAGNOSIS — L309 Dermatitis, unspecified: Secondary | ICD-10-CM | POA: Diagnosis not present

## 2020-07-01 DIAGNOSIS — R5383 Other fatigue: Secondary | ICD-10-CM | POA: Diagnosis not present

## 2020-07-02 DIAGNOSIS — I890 Lymphedema, not elsewhere classified: Secondary | ICD-10-CM | POA: Diagnosis not present

## 2020-07-02 DIAGNOSIS — R5383 Other fatigue: Secondary | ICD-10-CM | POA: Diagnosis not present

## 2020-07-02 DIAGNOSIS — Z170 Estrogen receptor positive status [ER+]: Secondary | ICD-10-CM | POA: Diagnosis not present

## 2020-07-02 DIAGNOSIS — C50811 Malignant neoplasm of overlapping sites of right female breast: Secondary | ICD-10-CM | POA: Diagnosis not present

## 2020-07-02 DIAGNOSIS — C50311 Malignant neoplasm of lower-inner quadrant of right female breast: Secondary | ICD-10-CM | POA: Diagnosis not present

## 2020-07-02 DIAGNOSIS — L309 Dermatitis, unspecified: Secondary | ICD-10-CM | POA: Diagnosis not present

## 2020-07-02 DIAGNOSIS — R609 Edema, unspecified: Secondary | ICD-10-CM | POA: Diagnosis not present

## 2020-07-03 DIAGNOSIS — R609 Edema, unspecified: Secondary | ICD-10-CM | POA: Diagnosis not present

## 2020-07-03 DIAGNOSIS — C50311 Malignant neoplasm of lower-inner quadrant of right female breast: Secondary | ICD-10-CM | POA: Diagnosis not present

## 2020-07-03 DIAGNOSIS — C50811 Malignant neoplasm of overlapping sites of right female breast: Secondary | ICD-10-CM | POA: Diagnosis not present

## 2020-07-03 DIAGNOSIS — I890 Lymphedema, not elsewhere classified: Secondary | ICD-10-CM | POA: Diagnosis not present

## 2020-07-03 DIAGNOSIS — Z170 Estrogen receptor positive status [ER+]: Secondary | ICD-10-CM | POA: Diagnosis not present

## 2020-07-03 DIAGNOSIS — L309 Dermatitis, unspecified: Secondary | ICD-10-CM | POA: Diagnosis not present

## 2020-07-03 DIAGNOSIS — R5383 Other fatigue: Secondary | ICD-10-CM | POA: Diagnosis not present

## 2020-07-06 DIAGNOSIS — L309 Dermatitis, unspecified: Secondary | ICD-10-CM | POA: Diagnosis not present

## 2020-07-06 DIAGNOSIS — R609 Edema, unspecified: Secondary | ICD-10-CM | POA: Diagnosis not present

## 2020-07-06 DIAGNOSIS — C50311 Malignant neoplasm of lower-inner quadrant of right female breast: Secondary | ICD-10-CM | POA: Diagnosis not present

## 2020-07-06 DIAGNOSIS — I890 Lymphedema, not elsewhere classified: Secondary | ICD-10-CM | POA: Diagnosis not present

## 2020-07-06 DIAGNOSIS — C50811 Malignant neoplasm of overlapping sites of right female breast: Secondary | ICD-10-CM | POA: Diagnosis not present

## 2020-07-06 DIAGNOSIS — R5383 Other fatigue: Secondary | ICD-10-CM | POA: Diagnosis not present

## 2020-07-06 DIAGNOSIS — Z170 Estrogen receptor positive status [ER+]: Secondary | ICD-10-CM | POA: Diagnosis not present

## 2020-07-07 DIAGNOSIS — I890 Lymphedema, not elsewhere classified: Secondary | ICD-10-CM | POA: Diagnosis not present

## 2020-07-07 DIAGNOSIS — Z170 Estrogen receptor positive status [ER+]: Secondary | ICD-10-CM | POA: Diagnosis not present

## 2020-07-07 DIAGNOSIS — R609 Edema, unspecified: Secondary | ICD-10-CM | POA: Diagnosis not present

## 2020-07-07 DIAGNOSIS — C50811 Malignant neoplasm of overlapping sites of right female breast: Secondary | ICD-10-CM | POA: Diagnosis not present

## 2020-07-07 DIAGNOSIS — L309 Dermatitis, unspecified: Secondary | ICD-10-CM | POA: Diagnosis not present

## 2020-07-07 DIAGNOSIS — C50311 Malignant neoplasm of lower-inner quadrant of right female breast: Secondary | ICD-10-CM | POA: Diagnosis not present

## 2020-07-07 DIAGNOSIS — R5383 Other fatigue: Secondary | ICD-10-CM | POA: Diagnosis not present

## 2020-07-08 DIAGNOSIS — C50811 Malignant neoplasm of overlapping sites of right female breast: Secondary | ICD-10-CM | POA: Diagnosis not present

## 2020-07-08 DIAGNOSIS — R5383 Other fatigue: Secondary | ICD-10-CM | POA: Diagnosis not present

## 2020-07-08 DIAGNOSIS — I890 Lymphedema, not elsewhere classified: Secondary | ICD-10-CM | POA: Diagnosis not present

## 2020-07-08 DIAGNOSIS — L309 Dermatitis, unspecified: Secondary | ICD-10-CM | POA: Diagnosis not present

## 2020-07-08 DIAGNOSIS — Z170 Estrogen receptor positive status [ER+]: Secondary | ICD-10-CM | POA: Diagnosis not present

## 2020-07-08 DIAGNOSIS — R609 Edema, unspecified: Secondary | ICD-10-CM | POA: Diagnosis not present

## 2020-07-08 DIAGNOSIS — C50311 Malignant neoplasm of lower-inner quadrant of right female breast: Secondary | ICD-10-CM | POA: Diagnosis not present

## 2020-07-09 DIAGNOSIS — C50311 Malignant neoplasm of lower-inner quadrant of right female breast: Secondary | ICD-10-CM | POA: Diagnosis not present

## 2020-07-09 DIAGNOSIS — R5383 Other fatigue: Secondary | ICD-10-CM | POA: Diagnosis not present

## 2020-07-09 DIAGNOSIS — I890 Lymphedema, not elsewhere classified: Secondary | ICD-10-CM | POA: Diagnosis not present

## 2020-07-09 DIAGNOSIS — C50811 Malignant neoplasm of overlapping sites of right female breast: Secondary | ICD-10-CM | POA: Diagnosis not present

## 2020-07-09 DIAGNOSIS — R609 Edema, unspecified: Secondary | ICD-10-CM | POA: Diagnosis not present

## 2020-07-09 DIAGNOSIS — L309 Dermatitis, unspecified: Secondary | ICD-10-CM | POA: Diagnosis not present

## 2020-07-09 DIAGNOSIS — Z170 Estrogen receptor positive status [ER+]: Secondary | ICD-10-CM | POA: Diagnosis not present

## 2020-07-10 DIAGNOSIS — L309 Dermatitis, unspecified: Secondary | ICD-10-CM | POA: Diagnosis not present

## 2020-07-10 DIAGNOSIS — I890 Lymphedema, not elsewhere classified: Secondary | ICD-10-CM | POA: Diagnosis not present

## 2020-07-10 DIAGNOSIS — R5383 Other fatigue: Secondary | ICD-10-CM | POA: Diagnosis not present

## 2020-07-10 DIAGNOSIS — R609 Edema, unspecified: Secondary | ICD-10-CM | POA: Diagnosis not present

## 2020-07-10 DIAGNOSIS — C50311 Malignant neoplasm of lower-inner quadrant of right female breast: Secondary | ICD-10-CM | POA: Diagnosis not present

## 2020-07-10 DIAGNOSIS — Z170 Estrogen receptor positive status [ER+]: Secondary | ICD-10-CM | POA: Diagnosis not present

## 2020-07-10 DIAGNOSIS — C50811 Malignant neoplasm of overlapping sites of right female breast: Secondary | ICD-10-CM | POA: Diagnosis not present

## 2020-07-13 DIAGNOSIS — R609 Edema, unspecified: Secondary | ICD-10-CM | POA: Diagnosis not present

## 2020-07-13 DIAGNOSIS — C50311 Malignant neoplasm of lower-inner quadrant of right female breast: Secondary | ICD-10-CM | POA: Diagnosis not present

## 2020-07-13 DIAGNOSIS — I890 Lymphedema, not elsewhere classified: Secondary | ICD-10-CM | POA: Diagnosis not present

## 2020-07-13 DIAGNOSIS — C50811 Malignant neoplasm of overlapping sites of right female breast: Secondary | ICD-10-CM | POA: Diagnosis not present

## 2020-07-13 DIAGNOSIS — R5383 Other fatigue: Secondary | ICD-10-CM | POA: Diagnosis not present

## 2020-07-13 DIAGNOSIS — Z170 Estrogen receptor positive status [ER+]: Secondary | ICD-10-CM | POA: Diagnosis not present

## 2020-07-13 DIAGNOSIS — L309 Dermatitis, unspecified: Secondary | ICD-10-CM | POA: Diagnosis not present

## 2020-07-14 DIAGNOSIS — C50811 Malignant neoplasm of overlapping sites of right female breast: Secondary | ICD-10-CM | POA: Diagnosis not present

## 2020-07-14 DIAGNOSIS — L309 Dermatitis, unspecified: Secondary | ICD-10-CM | POA: Diagnosis not present

## 2020-07-14 DIAGNOSIS — R609 Edema, unspecified: Secondary | ICD-10-CM | POA: Diagnosis not present

## 2020-07-14 DIAGNOSIS — Z170 Estrogen receptor positive status [ER+]: Secondary | ICD-10-CM | POA: Diagnosis not present

## 2020-07-14 DIAGNOSIS — R5383 Other fatigue: Secondary | ICD-10-CM | POA: Diagnosis not present

## 2020-07-14 DIAGNOSIS — I890 Lymphedema, not elsewhere classified: Secondary | ICD-10-CM | POA: Diagnosis not present

## 2020-07-14 DIAGNOSIS — C50311 Malignant neoplasm of lower-inner quadrant of right female breast: Secondary | ICD-10-CM | POA: Diagnosis not present

## 2020-07-15 DIAGNOSIS — L309 Dermatitis, unspecified: Secondary | ICD-10-CM | POA: Diagnosis not present

## 2020-07-15 DIAGNOSIS — C50811 Malignant neoplasm of overlapping sites of right female breast: Secondary | ICD-10-CM | POA: Diagnosis not present

## 2020-07-15 DIAGNOSIS — R609 Edema, unspecified: Secondary | ICD-10-CM | POA: Diagnosis not present

## 2020-07-15 DIAGNOSIS — Z170 Estrogen receptor positive status [ER+]: Secondary | ICD-10-CM | POA: Diagnosis not present

## 2020-07-15 DIAGNOSIS — I890 Lymphedema, not elsewhere classified: Secondary | ICD-10-CM | POA: Diagnosis not present

## 2020-07-15 DIAGNOSIS — R5383 Other fatigue: Secondary | ICD-10-CM | POA: Diagnosis not present

## 2020-07-16 DIAGNOSIS — C50311 Malignant neoplasm of lower-inner quadrant of right female breast: Secondary | ICD-10-CM | POA: Diagnosis not present

## 2020-07-16 DIAGNOSIS — C50811 Malignant neoplasm of overlapping sites of right female breast: Secondary | ICD-10-CM | POA: Diagnosis not present

## 2020-07-16 DIAGNOSIS — R609 Edema, unspecified: Secondary | ICD-10-CM | POA: Diagnosis not present

## 2020-07-16 DIAGNOSIS — R5383 Other fatigue: Secondary | ICD-10-CM | POA: Diagnosis not present

## 2020-07-16 DIAGNOSIS — Z170 Estrogen receptor positive status [ER+]: Secondary | ICD-10-CM | POA: Diagnosis not present

## 2020-07-16 DIAGNOSIS — L309 Dermatitis, unspecified: Secondary | ICD-10-CM | POA: Diagnosis not present

## 2020-07-16 DIAGNOSIS — I890 Lymphedema, not elsewhere classified: Secondary | ICD-10-CM | POA: Diagnosis not present

## 2020-07-17 DIAGNOSIS — C50811 Malignant neoplasm of overlapping sites of right female breast: Secondary | ICD-10-CM | POA: Diagnosis not present

## 2020-07-17 DIAGNOSIS — I890 Lymphedema, not elsewhere classified: Secondary | ICD-10-CM | POA: Diagnosis not present

## 2020-07-17 DIAGNOSIS — L309 Dermatitis, unspecified: Secondary | ICD-10-CM | POA: Diagnosis not present

## 2020-07-17 DIAGNOSIS — C50311 Malignant neoplasm of lower-inner quadrant of right female breast: Secondary | ICD-10-CM | POA: Diagnosis not present

## 2020-07-17 DIAGNOSIS — R5383 Other fatigue: Secondary | ICD-10-CM | POA: Diagnosis not present

## 2020-07-17 DIAGNOSIS — R609 Edema, unspecified: Secondary | ICD-10-CM | POA: Diagnosis not present

## 2020-07-17 DIAGNOSIS — Z170 Estrogen receptor positive status [ER+]: Secondary | ICD-10-CM | POA: Diagnosis not present

## 2020-07-20 DIAGNOSIS — Z170 Estrogen receptor positive status [ER+]: Secondary | ICD-10-CM | POA: Diagnosis not present

## 2020-07-20 DIAGNOSIS — R609 Edema, unspecified: Secondary | ICD-10-CM | POA: Diagnosis not present

## 2020-07-20 DIAGNOSIS — C50311 Malignant neoplasm of lower-inner quadrant of right female breast: Secondary | ICD-10-CM | POA: Diagnosis not present

## 2020-07-20 DIAGNOSIS — R5383 Other fatigue: Secondary | ICD-10-CM | POA: Diagnosis not present

## 2020-07-20 DIAGNOSIS — C50811 Malignant neoplasm of overlapping sites of right female breast: Secondary | ICD-10-CM | POA: Diagnosis not present

## 2020-07-20 DIAGNOSIS — L309 Dermatitis, unspecified: Secondary | ICD-10-CM | POA: Diagnosis not present

## 2020-07-20 DIAGNOSIS — I890 Lymphedema, not elsewhere classified: Secondary | ICD-10-CM | POA: Diagnosis not present

## 2020-08-31 DIAGNOSIS — H2513 Age-related nuclear cataract, bilateral: Secondary | ICD-10-CM | POA: Diagnosis not present

## 2020-08-31 DIAGNOSIS — H524 Presbyopia: Secondary | ICD-10-CM | POA: Diagnosis not present

## 2020-09-15 DIAGNOSIS — C50911 Malignant neoplasm of unspecified site of right female breast: Secondary | ICD-10-CM | POA: Diagnosis not present

## 2020-09-15 DIAGNOSIS — L658 Other specified nonscarring hair loss: Secondary | ICD-10-CM | POA: Diagnosis not present

## 2020-09-15 DIAGNOSIS — Z08 Encounter for follow-up examination after completed treatment for malignant neoplasm: Secondary | ICD-10-CM | POA: Diagnosis not present

## 2020-09-15 DIAGNOSIS — T451X5A Adverse effect of antineoplastic and immunosuppressive drugs, initial encounter: Secondary | ICD-10-CM | POA: Diagnosis not present

## 2020-09-15 DIAGNOSIS — Z170 Estrogen receptor positive status [ER+]: Secondary | ICD-10-CM | POA: Diagnosis not present

## 2020-09-15 DIAGNOSIS — C773 Secondary and unspecified malignant neoplasm of axilla and upper limb lymph nodes: Secondary | ICD-10-CM | POA: Diagnosis not present

## 2020-09-15 DIAGNOSIS — C50811 Malignant neoplasm of overlapping sites of right female breast: Secondary | ICD-10-CM | POA: Diagnosis not present

## 2020-09-15 DIAGNOSIS — Z853 Personal history of malignant neoplasm of breast: Secondary | ICD-10-CM | POA: Diagnosis not present

## 2020-11-03 ENCOUNTER — Other Ambulatory Visit: Payer: Self-pay | Admitting: Nurse Practitioner

## 2020-11-03 DIAGNOSIS — I10 Essential (primary) hypertension: Secondary | ICD-10-CM

## 2020-11-04 ENCOUNTER — Other Ambulatory Visit: Payer: Self-pay | Admitting: Nurse Practitioner

## 2020-11-04 DIAGNOSIS — E039 Hypothyroidism, unspecified: Secondary | ICD-10-CM

## 2020-11-19 ENCOUNTER — Ambulatory Visit (INDEPENDENT_AMBULATORY_CARE_PROVIDER_SITE_OTHER): Payer: Medicare Other | Admitting: Nurse Practitioner

## 2020-11-19 ENCOUNTER — Other Ambulatory Visit: Payer: Self-pay

## 2020-11-19 ENCOUNTER — Encounter: Payer: Self-pay | Admitting: Nurse Practitioner

## 2020-11-19 DIAGNOSIS — E039 Hypothyroidism, unspecified: Secondary | ICD-10-CM

## 2020-11-19 DIAGNOSIS — C50811 Malignant neoplasm of overlapping sites of right female breast: Secondary | ICD-10-CM | POA: Diagnosis not present

## 2020-11-19 DIAGNOSIS — Z1231 Encounter for screening mammogram for malignant neoplasm of breast: Secondary | ICD-10-CM

## 2020-11-19 DIAGNOSIS — E782 Mixed hyperlipidemia: Secondary | ICD-10-CM

## 2020-11-19 DIAGNOSIS — I10 Essential (primary) hypertension: Secondary | ICD-10-CM

## 2020-11-19 DIAGNOSIS — E559 Vitamin D deficiency, unspecified: Secondary | ICD-10-CM | POA: Diagnosis not present

## 2020-11-19 DIAGNOSIS — Z0189 Encounter for other specified special examinations: Secondary | ICD-10-CM

## 2020-11-19 DIAGNOSIS — M545 Low back pain, unspecified: Secondary | ICD-10-CM | POA: Diagnosis not present

## 2020-11-19 DIAGNOSIS — Z0001 Encounter for general adult medical examination with abnormal findings: Secondary | ICD-10-CM

## 2020-11-19 DIAGNOSIS — N390 Urinary tract infection, site not specified: Secondary | ICD-10-CM | POA: Diagnosis not present

## 2020-11-19 DIAGNOSIS — Z7689 Persons encountering health services in other specified circumstances: Secondary | ICD-10-CM | POA: Diagnosis not present

## 2020-11-19 DIAGNOSIS — Z170 Estrogen receptor positive status [ER+]: Secondary | ICD-10-CM | POA: Diagnosis not present

## 2020-11-19 DIAGNOSIS — Z853 Personal history of malignant neoplasm of breast: Secondary | ICD-10-CM

## 2020-11-19 MED ORDER — AMLODIPINE BESYLATE 5 MG PO TABS: 5.0000 mg | ORAL_TABLET | Freq: Every day | ORAL | 3 refills | Status: DC

## 2020-11-19 MED ORDER — LEVOTHYROXINE SODIUM 25 MCG PO TABS: 25.0000 ug | ORAL_TABLET | Freq: Every day | ORAL | 3 refills | Status: DC

## 2020-11-19 NOTE — Progress Notes (Signed)
[facility] [place], [place] [address]  Internal MEDICINE  Office Visit Note  Patient Name: Carolyn Reyes  [id number]  [id number]  Date of Service: 11/26/2020  Chief Complaint  Patient presents with   Hypertension   Hypothyroidism   Medicare Wellness    Med refills    HPI [name] presents for an annual well visit and physical exam. she has a history of hypertension, hypothyroidism, and breast cancer s/p lumpectomy, chemo and radiation. She is in remission at this time. She lives at home alone, still independent, still driving, and retired from [employer]. She is due for a screening mammogram. Her screening colonoscopy is due in 2023. She has received 2 doses of COVID vaccine and both boosters. She has received 1 dose of the pneumococcal vaccine and has declined the second dose.  She is requesting routine annual labs.  She is requesting refills for amlodipine and levothyroxine.  She denies any pain or other questions or concerns today.   Current Medication: Outpatient Encounter Medications as of 11/19/2020  Medication Sig   anastrozole (ARIMIDEX) 1 MG tablet Take 1 mg by mouth daily.   aspirin 81 MG chewable tablet Chew 81 mg by mouth daily.   Calcium Carbonate-Vit D-Min (CALCIUM 600+D3 PLUS MINERALS) 600-800 MG-UNIT TABS Take by mouth.   clobetasol cream (TEMOVATE) 6.80 % Apply 1 application topically 2 (two) times daily.   traMADol-acetaminophen (ULTRACET) 37.5-325 MG tablet Take 1 tablet by mouth every 4 (four) hours as needed (PRN).   UNABLE TO FIND prn   [DISCONTINUED] amLODipine (NORVASC) 5 MG tablet Take 1 tablet (5 mg total) by mouth daily.   [DISCONTINUED] levothyroxine (SYNTHROID) 25 MCG tablet Take 1 tablet (25 mcg total) by mouth daily before breakfast.   amLODipine (NORVASC) 5 MG tablet Take 1 tablet (5 mg total) by mouth daily.   levothyroxine (SYNTHROID) 25 MCG tablet Take 1 tablet (25 mcg total) by mouth daily before breakfast.    No facility-administered encounter medications on file as of 11/19/2020.    Surgical History: Past Surgical History:  Procedure Laterality Date   BREAST SURGERY Left    FIBROID CYST REMOVED     Medical History: Past Medical History:  Diagnosis Date   Hypertension    Thyroid disease     Family History: Family History  Problem Relation Age of Onset   Diabetes Mother    Hypertension Mother    Hypertension Father     Social History   Socioeconomic History   Marital status: Married    Spouse name: Not on file   Number of children: Not on file   Years of education: Not on file   Highest education level: Not on file  Occupational History   Not on file  Tobacco Use   Smoking status: Never   Smokeless tobacco: Never  Vaping Use   Vaping Use: Never used  Substance and Sexual Activity   Alcohol use: Never   Drug use: Never   Sexual activity: Not on file  Other Topics Concern   Not on file  Social History Narrative   Not on file   Social Determinants of Health   Financial Resource Strain: Not on file  Food Insecurity: Not on file  Transportation Needs: Not on file  Physical Activity: Not on file  Stress: Not on file  Social Connections: Not on file  Intimate Partner Violence: Not on file      Review of Systems  Constitutional:  Negative for activity change,  appetite change, chills, fatigue, fever and unexpected weight change.  HENT: Negative.  Negative for congestion, ear pain, rhinorrhea, sore throat and trouble swallowing.   Eyes: Negative.   Respiratory: Negative.  Negative for cough, chest tightness, shortness of breath and wheezing.   Cardiovascular: Negative.  Negative for chest pain.  Gastrointestinal: Negative.  Negative for abdominal pain, blood in stool, constipation, diarrhea, nausea and vomiting.  Endocrine: Negative.   Genitourinary: Negative.  Negative for difficulty urinating, dysuria, frequency, hematuria and urgency.  Musculoskeletal:  Negative.  Negative for arthralgias, back pain, joint swelling, myalgias and neck pain.  Skin: Negative.  Negative for rash and wound.  Allergic/Immunologic: Negative.  Negative for immunocompromised state.  Neurological: Negative.  Negative for dizziness, seizures, numbness and headaches.  Hematological: Negative.   Psychiatric/Behavioral: Negative.  Negative for behavioral problems, self-injury and suicidal ideas. The patient is not nervous/anxious.    Vital Signs: BP 122/88   Pulse 91   Temp 98.2 F (36.8 C)   Resp 16   Ht '5\' 5"'  (1.651 m)   Wt 147 lb 9.6 oz (67 kg)   SpO2 99%   BMI 24.56 kg/m    Physical Exam Vitals reviewed.  Constitutional:      General: She is not in acute distress.    Appearance: She is well-developed. She is not diaphoretic.  HENT:     Head: Normocephalic and atraumatic.     Mouth/Throat:     Pharynx: No oropharyngeal exudate.  Eyes:     Pupils: Pupils are equal, round, and reactive to light.  Neck:     Thyroid: No thyromegaly.     Vascular: No JVD.     Trachea: No tracheal deviation.  Cardiovascular:     Rate and Rhythm: Normal rate and regular rhythm.     Heart sounds: Normal heart sounds. No murmur heard.   No friction rub. No gallop.  Pulmonary:     Effort: Pulmonary effort is normal. No respiratory distress.     Breath sounds: No wheezing or rales.  Chest:     Chest wall: No tenderness.  Abdominal:     General: Bowel sounds are normal.     Palpations: Abdomen is soft.  Musculoskeletal:        General: Normal range of motion.     Cervical back: Normal range of motion and neck supple.  Lymphadenopathy:     Cervical: No cervical adenopathy.  Skin:    General: Skin is warm and dry.     Capillary Refill: Capillary refill takes less than 2 seconds.  Neurological:     Mental Status: She is alert and oriented to person, place, and time.     Cranial Nerves: No cranial nerve deficit.  Psychiatric:        Mood and Affect: Mood normal.         Behavior: Behavior normal.        Thought Content: Thought content normal.        Judgment: Judgment normal.       Assessment/Plan: 1. Encounter for routine adult health examination with abnormal findings Age-appropriate preventive screenings discussed, annual physical exam completed.    2. Encounter for routine laboratory testing Routine labs ordered for health maintenance.  - CBC with Differential/Platelet - CMP14+EGFR  3. Acquired hypothyroidism Levothyroxine refilled, recheck TSH and free T4 - levothyroxine (SYNTHROID) 25 MCG tablet; Take 1 tablet (25 mcg total) by mouth daily before breakfast.  Dispense: 90 tablet; Refill: 3 - TSH + free T4  4. Essential hypertension Stable, amlodipine refilled.  - amLODipine (NORVASC) 5 MG tablet; Take 1 tablet (5 mg total) by mouth daily.  Dispense: 90 tablet; Refill: 3  5. Mixed hyperlipidemia Recheck lipid panel. Not currently on any medication.   - Lipid Panel With LDL/HDL Ratio  6. Vitamin D deficiency Check vitamin D level, currently taking a supplement combined with calcium.  - Vitamin D (25 hydroxy)  7. Encounter for screening mammogram for malignant neoplasm of breast Screening mammogram ordered.  - MM Digital Screening; Future  8. Neoplasm of breast cancer Stable, followed by oncology, takes anastrozole. - anastrozole (ARIMIDEX) 1 MG tablet; Take 1 mg by mouth daily.     General Counseling: Sion verbalizes understanding of the findings of todays visit and agrees with plan of treatment. I have discussed any further diagnostic evaluation that may be needed or ordered today. We also reviewed her medications today. she has been encouraged to call the office with any questions or concerns that should arise related to todays visit.    Orders Placed This Encounter  Procedures   CULTURE, URINE COMPREHENSIVE   MM Digital Screening   CBC with Differential/Platelet   CMP14+EGFR   Vitamin D (25 hydroxy)   Lipid  Panel With LDL/HDL Ratio   TSH + free T4   Urinalysis, Routine w reflex microscopic   Specimen status report    Meds ordered this encounter  Medications   levothyroxine (SYNTHROID) 25 MCG tablet    Sig: Take 1 tablet (25 mcg total) by mouth daily before breakfast.    Dispense:  90 tablet    Refill:  3   amLODipine (NORVASC) 5 MG tablet    Sig: Take 1 tablet (5 mg total) by mouth daily.    Dispense:  90 tablet    Refill:  3    Pt need follow up appt    Return in about 1 year (around 11/19/2021) for CPE, Khoen Genet PCP.   Total time spent:30 Minutes Time spent includes review of chart, medications, test results, and follow up plan with the patient.   [place] Controlled Substance Database was reviewed by me.  This patient was seen by Jonetta Osgood, FNP-C in collaboration with Dr. Clayborn Bigness as a part of collaborative care agreement.  Michaelia Beilfuss R. Valetta Fuller, MSN, FNP-C Internal medicine

## 2020-11-20 ENCOUNTER — Telehealth: Payer: Self-pay

## 2020-11-20 LAB — CBC WITH DIFFERENTIAL/PLATELET
Basophils Absolute: 0 10*3/uL (ref 0.0–0.2)
Basos: 0 %
EOS (ABSOLUTE): 0.1 10*3/uL (ref 0.0–0.4)
Eos: 1 %
Hematocrit: 42.6 % (ref 34.0–46.6)
Hemoglobin: 14.3 g/dL (ref 11.1–15.9)
Immature Grans (Abs): 0 10*3/uL (ref 0.0–0.1)
Immature Granulocytes: 0 %
Lymphocytes Absolute: 0.9 10*3/uL (ref 0.7–3.1)
Lymphs: 25 %
MCH: 29.2 pg (ref 26.6–33.0)
MCHC: 33.6 g/dL (ref 31.5–35.7)
MCV: 87 fL (ref 79–97)
Monocytes Absolute: 0.3 10*3/uL (ref 0.1–0.9)
Monocytes: 7 %
Neutrophils Absolute: 2.3 10*3/uL (ref 1.4–7.0)
Neutrophils: 67 %
Platelets: 232 10*3/uL (ref 150–450)
RBC: 4.9 x10E6/uL (ref 3.77–5.28)
RDW: 13 % (ref 11.7–15.4)
WBC: 3.5 10*3/uL (ref 3.4–10.8)

## 2020-11-20 LAB — VITAMIN D 25 HYDROXY (VIT D DEFICIENCY, FRACTURES): Vit D, 25-Hydroxy: 35.3 ng/mL (ref 30.0–100.0)

## 2020-11-20 LAB — CMP14+EGFR
ALT: 21 IU/L (ref 0–32)
AST: 22 IU/L (ref 0–40)
Albumin/Globulin Ratio: 1.9 (ref 1.2–2.2)
Albumin: 4.5 g/dL (ref 3.7–4.7)
Alkaline Phosphatase: 135 IU/L — ABNORMAL HIGH (ref 44–121)
BUN/Creatinine Ratio: 13 (ref 12–28)
BUN: 10 mg/dL (ref 8–27)
Bilirubin Total: 0.5 mg/dL (ref 0.0–1.2)
CO2: 20 mmol/L (ref 20–29)
Calcium: 10.3 mg/dL (ref 8.7–10.3)
Chloride: 107 mmol/L — ABNORMAL HIGH (ref 96–106)
Creatinine, Ser: 0.76 mg/dL (ref 0.57–1.00)
Globulin, Total: 2.4 g/dL (ref 1.5–4.5)
Glucose: 103 mg/dL — ABNORMAL HIGH (ref 65–99)
Potassium: 4 mmol/L (ref 3.5–5.2)
Sodium: 142 mmol/L (ref 134–144)
Total Protein: 6.9 g/dL (ref 6.0–8.5)
eGFR: 82 mL/min/{1.73_m2} (ref >59)

## 2020-11-20 LAB — LIPID PANEL WITH LDL/HDL RATIO
Cholesterol, Total: 234 mg/dL — ABNORMAL HIGH (ref 100–199)
HDL: 71 mg/dL (ref >39)
LDL Chol Calc (NIH): 140 mg/dL — ABNORMAL HIGH (ref 0–99)
LDL/HDL Ratio: 2 ratio (ref 0.0–3.2)
Triglycerides: 133 mg/dL (ref 0–149)
VLDL Cholesterol Cal: 23 mg/dL (ref 5–40)

## 2020-11-20 LAB — TSH+FREE T4
Free T4: 1.07 ng/dL (ref 0.82–1.77)
TSH: 0.82 u[IU]/mL (ref 0.450–4.500)

## 2020-11-20 NOTE — Telephone Encounter (Signed)
Gave verbal to [facility] to  add on UA

## 2020-11-23 LAB — SPECIMEN STATUS REPORT

## 2020-11-23 LAB — URINALYSIS, ROUTINE W REFLEX MICROSCOPIC
Bilirubin, UA: NEGATIVE
Glucose, UA: NEGATIVE
Ketones, UA: NEGATIVE
Leukocytes,UA: NEGATIVE
Nitrite, UA: NEGATIVE
Protein,UA: NEGATIVE
RBC, UA: NEGATIVE
Specific Gravity, UA: 1.013 (ref 1.005–1.030)
Urobilinogen, Ur: 0.2 mg/dL (ref 0.2–1.0)
pH, UA: 6.5 (ref 5.0–7.5)

## 2020-11-24 LAB — CULTURE, URINE COMPREHENSIVE

## 2020-12-15 DIAGNOSIS — L819 Disorder of pigmentation, unspecified: Secondary | ICD-10-CM | POA: Diagnosis not present

## 2020-12-15 DIAGNOSIS — Z08 Encounter for follow-up examination after completed treatment for malignant neoplasm: Secondary | ICD-10-CM | POA: Diagnosis not present

## 2020-12-15 DIAGNOSIS — Z853 Personal history of malignant neoplasm of breast: Secondary | ICD-10-CM | POA: Diagnosis not present

## 2020-12-15 DIAGNOSIS — C50811 Malignant neoplasm of overlapping sites of right female breast: Secondary | ICD-10-CM | POA: Diagnosis not present

## 2020-12-15 DIAGNOSIS — Z170 Estrogen receptor positive status [ER+]: Secondary | ICD-10-CM | POA: Diagnosis not present

## 2021-01-25 ENCOUNTER — Other Ambulatory Visit: Payer: Self-pay

## 2021-03-12 ENCOUNTER — Telehealth: Payer: Self-pay | Admitting: Nurse Practitioner

## 2021-03-12 NOTE — Chronic Care Management (AMB) (Signed)
  Chronic Care Management   Outreach Note  03/12/2021 Name: Carolyn Reyes MRN: [id number] DOB: 05/01/1946  Referred by: Jonetta Osgood, NP Reason for referral : No chief complaint on file.   An unsuccessful telephone outreach was attempted today. The patient was referred to the pharmacist for assistance with care management and care coordination.   Follow Up Plan:   Tatjana Dellinger Upstream Scheduler

## 2021-03-18 DIAGNOSIS — C50811 Malignant neoplasm of overlapping sites of right female breast: Secondary | ICD-10-CM | POA: Diagnosis not present

## 2021-03-18 DIAGNOSIS — Z170 Estrogen receptor positive status [ER+]: Secondary | ICD-10-CM | POA: Diagnosis not present

## 2021-05-04 ENCOUNTER — Other Ambulatory Visit: Payer: Self-pay

## 2021-05-04 DIAGNOSIS — Z0189 Encounter for other specified special examinations: Secondary | ICD-10-CM

## 2021-05-04 DIAGNOSIS — E559 Vitamin D deficiency, unspecified: Secondary | ICD-10-CM

## 2021-05-04 DIAGNOSIS — Z170 Estrogen receptor positive status [ER+]: Secondary | ICD-10-CM

## 2021-05-04 DIAGNOSIS — I10 Essential (primary) hypertension: Secondary | ICD-10-CM

## 2021-05-04 DIAGNOSIS — Z1231 Encounter for screening mammogram for malignant neoplasm of breast: Secondary | ICD-10-CM

## 2021-05-04 DIAGNOSIS — Z0001 Encounter for general adult medical examination with abnormal findings: Secondary | ICD-10-CM

## 2021-05-04 DIAGNOSIS — M545 Low back pain, unspecified: Secondary | ICD-10-CM

## 2021-05-04 DIAGNOSIS — E782 Mixed hyperlipidemia: Secondary | ICD-10-CM

## 2021-05-04 DIAGNOSIS — E039 Hypothyroidism, unspecified: Secondary | ICD-10-CM

## 2021-05-04 MED ORDER — LEVOTHYROXINE SODIUM 25 MCG PO TABS: ORAL_TABLET | ORAL | 3 refills | Status: DC

## 2021-05-04 MED ORDER — AMLODIPINE BESYLATE 5 MG PO TABS: ORAL_TABLET | ORAL | 3 refills | Status: DC

## 2021-06-21 DIAGNOSIS — C50811 Malignant neoplasm of overlapping sites of right female breast: Secondary | ICD-10-CM | POA: Diagnosis not present

## 2021-06-21 DIAGNOSIS — Z08 Encounter for follow-up examination after completed treatment for malignant neoplasm: Secondary | ICD-10-CM | POA: Diagnosis not present

## 2021-06-21 DIAGNOSIS — Z853 Personal history of malignant neoplasm of breast: Secondary | ICD-10-CM | POA: Diagnosis not present

## 2021-06-21 DIAGNOSIS — Z170 Estrogen receptor positive status [ER+]: Secondary | ICD-10-CM | POA: Diagnosis not present

## 2021-09-14 DIAGNOSIS — H2513 Age-related nuclear cataract, bilateral: Secondary | ICD-10-CM | POA: Diagnosis not present

## 2021-11-23 ENCOUNTER — Telehealth: Payer: Self-pay

## 2021-11-23 ENCOUNTER — Ambulatory Visit (INDEPENDENT_AMBULATORY_CARE_PROVIDER_SITE_OTHER): Payer: Medicare Other | Admitting: Nurse Practitioner

## 2021-11-23 ENCOUNTER — Other Ambulatory Visit: Payer: Self-pay

## 2021-11-23 ENCOUNTER — Encounter: Payer: Self-pay | Admitting: Nurse Practitioner

## 2021-11-23 VITALS — BP 128/79 | HR 89 | Temp 98.5°F | Resp 16 | Ht 65.5 in | Wt 149.8 lb

## 2021-11-23 DIAGNOSIS — E559 Vitamin D deficiency, unspecified: Secondary | ICD-10-CM

## 2021-11-23 DIAGNOSIS — E039 Hypothyroidism, unspecified: Secondary | ICD-10-CM

## 2021-11-23 DIAGNOSIS — E782 Mixed hyperlipidemia: Secondary | ICD-10-CM

## 2021-11-23 DIAGNOSIS — Z1211 Encounter for screening for malignant neoplasm of colon: Secondary | ICD-10-CM

## 2021-11-23 DIAGNOSIS — Z1212 Encounter for screening for malignant neoplasm of rectum: Secondary | ICD-10-CM

## 2021-11-23 DIAGNOSIS — Z0001 Encounter for general adult medical examination with abnormal findings: Secondary | ICD-10-CM | POA: Diagnosis not present

## 2021-11-23 DIAGNOSIS — R300 Dysuria: Secondary | ICD-10-CM

## 2021-11-23 NOTE — Telephone Encounter (Signed)
Patient has requested SuTab Bowel Prep for her colonoscopy.  Explained to her that Meredith Leeds is not preferred bowel prep for her insurance.  She stated that she can not tolerate liquid preps.  Informed that that SuTab is not a single pill-it consist of 2 bottles of pills with 12 pills to each bottle and she needs to take them at 5 pm and 5 hours prior to colonoscopy.  Coupon has been sent with her colonoscopy instructions.  Thanks,  [place], [place]

## 2021-11-23 NOTE — Telephone Encounter (Signed)
Pt called that she need name for cream  scar at her thumb as per alyssa gave her Mederma scar cream

## 2021-11-23 NOTE — Progress Notes (Signed)
[facility] [place], [place] [address]  Internal MEDICINE  Office Visit Note  Patient Name: Carolyn Reyes  [id number]  [id number]  Date of Service: 11/23/2021  Chief Complaint  Patient presents with   Medicare Wellness    Wants to have left thumb looked at   Hypertension    HPI Carolyn Reyes presents for an annual well visit and physical exam.  She is a well-appearing 76 year old female with hypertension, hypothyroidism, vitamin D deficiency and history of breast cancer.  She is followed by oncology and continues to take anastrozole.  Her blood pressure is well controlled with current medications and her other vital signs are within normal limits.  She is in need of refills of medications and routine labs.  She is also due for routine colonoscopy, her last colonoscopy was in 2013. She also cannot tolerate liquid prep for colonoscopy and must have pills. So this will be explained in the comments of the referral order.  She has an upcoming appointment with her oncologist on Thursday this week.      Current Medication: Outpatient Encounter Medications as of 11/23/2021  Medication Sig   amLODipine (NORVASC) 5 MG tablet Take 1 tablet (5 mg total) by mouth daily, at bedtime.   anastrozole (ARIMIDEX) 1 MG tablet Take 1 mg by mouth daily.   aspirin 81 MG chewable tablet Chew 81 mg by mouth daily.   Calcium Carbonate-Vit D-Min (CALCIUM 600+D3 PLUS MINERALS) 600-800 MG-UNIT TABS Take by mouth.   levothyroxine (SYNTHROID) 25 MCG tablet Take 1 tablet (25 mcg total) by mouth daily 1 hour before breakfast.   UNABLE TO FIND prn   [DISCONTINUED] clobetasol cream (TEMOVATE) 3.32 % Apply 1 application topically 2 (two) times daily.   [DISCONTINUED] traMADol-acetaminophen (ULTRACET) 37.5-325 MG tablet Take 1 tablet by mouth every 4 (four) hours as needed (PRN).   No facility-administered encounter medications on file as of 11/23/2021.    Surgical History: Past Surgical  History:  Procedure Laterality Date   BREAST SURGERY Left    FIBROID CYST REMOVED     Medical History: Past Medical History:  Diagnosis Date   Hypertension    Thyroid disease     Family History: Family History  Problem Relation Age of Onset   Diabetes Mother    Hypertension Mother    Hypertension Father     Social History   Socioeconomic History   Marital status: Married    Spouse name: Not on file   Number of children: Not on file   Years of education: Not on file   Highest education level: Not on file  Occupational History   Not on file  Tobacco Use   Smoking status: Never   Smokeless tobacco: Never  Vaping Use   Vaping Use: Never used  Substance and Sexual Activity   Alcohol use: Never   Drug use: Never   Sexual activity: Not on file  Other Topics Concern   Not on file  Social History Narrative   Not on file   Social Determinants of Health   Financial Resource Strain: Not on file  Food Insecurity: Not on file  Transportation Needs: Not on file  Physical Activity: Not on file  Stress: Not on file  Social Connections: Not on file  Intimate Partner Violence: Not on file      Review of Systems  Constitutional:  Negative for activity change, appetite change, chills, fatigue, fever and unexpected weight change.  HENT: Negative.  Negative for congestion, ear  pain, rhinorrhea, sore throat and trouble swallowing.   Eyes: Negative.   Respiratory: Negative.  Negative for cough, chest tightness, shortness of breath and wheezing.   Cardiovascular: Negative.  Negative for chest pain.  Gastrointestinal: Negative.  Negative for abdominal pain, blood in stool, constipation, diarrhea, nausea and vomiting.  Endocrine: Negative.   Genitourinary: Negative.  Negative for difficulty urinating, dysuria, frequency, hematuria and urgency.  Musculoskeletal: Negative.  Negative for arthralgias, back pain, joint swelling, myalgias and neck pain.  Skin: Negative.  Negative  for rash and wound.  Allergic/Immunologic: Negative.  Negative for immunocompromised state.  Neurological: Negative.  Negative for dizziness, seizures, numbness and headaches.  Hematological: Negative.   Psychiatric/Behavioral: Negative.  Negative for behavioral problems, self-injury and suicidal ideas. The patient is not nervous/anxious.     Vital Signs: BP 128/79   Pulse 89   Temp 98.5 F (36.9 C)   Resp 16   Ht 5' 5.5" (1.664 m)   Wt 149 lb 12.8 oz (67.9 kg)   SpO2 98%   BMI 24.55 kg/m    Physical Exam Vitals reviewed.  Constitutional:      General: She is awake. She is not in acute distress.    Appearance: Normal appearance. She is well-developed, well-groomed and normal weight. She is not ill-appearing or diaphoretic.  HENT:     Head: Normocephalic and atraumatic.     Right Ear: Tympanic membrane, ear canal and external ear normal.     Left Ear: Tympanic membrane, ear canal and external ear normal.     Nose: Nose normal. No congestion or rhinorrhea.     Mouth/Throat:     Lips: Pink.     Mouth: Mucous membranes are moist.     Pharynx: Oropharynx is clear. Uvula midline. No oropharyngeal exudate or posterior oropharyngeal erythema.  Eyes:     General: Lids are normal. Vision grossly intact. Gaze aligned appropriately.     Extraocular Movements: Extraocular movements intact.     Conjunctiva/sclera: Conjunctivae normal.     Pupils: Pupils are equal, round, and reactive to light.     Funduscopic exam:    Right eye: Red reflex present.        Left eye: Red reflex present. Neck:     Thyroid: No thyromegaly.     Vascular: No carotid bruit or JVD.     Trachea: Trachea and phonation normal. No tracheal deviation.  Cardiovascular:     Rate and Rhythm: Normal rate and regular rhythm.     Pulses: Normal pulses.     Heart sounds: Normal heart sounds, S1 normal and S2 normal. No murmur heard.    No friction rub. No gallop.  Pulmonary:     Effort: Pulmonary effort is normal.  No accessory muscle usage or respiratory distress.     Breath sounds: Normal breath sounds and air entry. No wheezing or rales.  Chest:     Chest wall: No tenderness.  Breasts:    Right: Normal. No swelling, bleeding, inverted nipple, mass, nipple discharge, skin change or tenderness.     Left: Normal. No swelling, bleeding, inverted nipple, mass, nipple discharge, skin change or tenderness.  Abdominal:     General: Bowel sounds are normal.     Palpations: Abdomen is soft. There is no shifting dullness, fluid wave, mass or pulsatile mass.     Tenderness: There is no abdominal tenderness.  Musculoskeletal:        General: Normal range of motion.     Cervical back: Normal  range of motion and neck supple.     Right lower leg: No edema.     Left lower leg: No edema.  Lymphadenopathy:     Cervical: No cervical adenopathy.     Upper Body:     Right upper body: No supraclavicular, axillary or pectoral adenopathy.     Left upper body: No supraclavicular, axillary or pectoral adenopathy.  Skin:    General: Skin is warm and dry.     Capillary Refill: Capillary refill takes less than 2 seconds.  Neurological:     Mental Status: She is alert and oriented to person, place, and time.     Cranial Nerves: No cranial nerve deficit.  Psychiatric:        Mood and Affect: Mood normal.        Behavior: Behavior normal. Behavior is cooperative.        Thought Content: Thought content normal.        Judgment: Judgment normal.        Assessment/Plan: 1. Encounter for routine adult health examination with abnormal findings Age-appropriate preventive screenings and vaccinations discussed, annual physical exam completed. Routine labs for health maintenance ordered, see below. PHM updated.  - CBC with Differential/Platelet - CMP14+EGFR - TSH + free T4 - Vitamin D (25 hydroxy) - Lipid Profile  2. Mixed hyperlipidemia Labs ordered.  - CBC with Differential/Platelet - CMP14+EGFR - TSH + free  T4 - Lipid Profile  3. Vitamin D deficiency Labs ordered - Vitamin D (25 hydroxy)  4. Acquired hypothyroidism Labs ordered - CBC with Differential/Platelet - CMP14+EGFR - TSH + free T4 - Lipid Profile  5. Screening for colorectal cancer - Ambulatory referral to Gastroenterology  6. Dysuria - UA/M w/rflx Culture, Routine      General Counseling: Carolyn Reyes verbalizes understanding of the findings of todays visit and agrees with plan of treatment. I have discussed any further diagnostic evaluation that may be needed or ordered today. We also reviewed her medications today. she has been encouraged to call the office with any questions or concerns that should arise related to todays visit.    Orders Placed This Encounter  Procedures   Microscopic Examination   Urine Culture, Reflex   UA/M w/rflx Culture, Routine   CBC with Differential/Platelet   CMP14+EGFR   TSH + free T4   Vitamin D (25 hydroxy)   Lipid Profile   Ambulatory referral to Gastroenterology    No orders of the defined types were placed in this encounter.   Return in about 1 year (around 11/24/2022) for CPE, [place] PCP.   Total time spent:30 Minutes Time spent includes review of chart, medications, test results, and follow up plan with the patient.   [place] Controlled Substance Database was reviewed by me.  This patient was seen by Jonetta Osgood, FNP-C in collaboration with Dr. Clayborn Bigness as a part of collaborative care agreement.  Carolyn Thebeau R. Valetta Fuller, MSN, FNP-C Internal medicine

## 2021-11-24 LAB — CMP14+EGFR
ALT: 27 IU/L (ref 0–32)
AST: 32 IU/L (ref 0–40)
Albumin/Globulin Ratio: 1.7 (ref 1.2–2.2)
Albumin: 4.4 g/dL (ref 3.7–4.7)
Alkaline Phosphatase: 127 IU/L — ABNORMAL HIGH (ref 44–121)
BUN/Creatinine Ratio: 20 (ref 12–28)
BUN: 15 mg/dL (ref 8–27)
Bilirubin Total: 0.5 mg/dL (ref 0.0–1.2)
CO2: 20 mmol/L (ref 20–29)
Calcium: 10.3 mg/dL (ref 8.7–10.3)
Chloride: 106 mmol/L (ref 96–106)
Creatinine, Ser: 0.75 mg/dL (ref 0.57–1.00)
Globulin, Total: 2.6 g/dL (ref 1.5–4.5)
Glucose: 106 mg/dL — ABNORMAL HIGH (ref 70–99)
Potassium: 4 mmol/L (ref 3.5–5.2)
Sodium: 141 mmol/L (ref 134–144)
Total Protein: 7 g/dL (ref 6.0–8.5)
eGFR: 83 mL/min/{1.73_m2} (ref >59)

## 2021-11-24 LAB — CBC WITH DIFFERENTIAL/PLATELET
Basophils Absolute: 0 10*3/uL (ref 0.0–0.2)
Basos: 0 %
EOS (ABSOLUTE): 0.1 10*3/uL (ref 0.0–0.4)
Eos: 2 %
Hematocrit: 40.6 % (ref 34.0–46.6)
Hemoglobin: 14.1 g/dL (ref 11.1–15.9)
Immature Grans (Abs): 0 10*3/uL (ref 0.0–0.1)
Immature Granulocytes: 0 %
Lymphocytes Absolute: 1.2 10*3/uL (ref 0.7–3.1)
Lymphs: 33 %
MCH: 29.3 pg (ref 26.6–33.0)
MCHC: 34.7 g/dL (ref 31.5–35.7)
MCV: 84 fL (ref 79–97)
Monocytes Absolute: 0.3 10*3/uL (ref 0.1–0.9)
Monocytes: 7 %
Neutrophils Absolute: 2.1 10*3/uL (ref 1.4–7.0)
Neutrophils: 58 %
Platelets: 217 10*3/uL (ref 150–450)
RBC: 4.82 x10E6/uL (ref 3.77–5.28)
RDW: 12.8 % (ref 11.7–15.4)
WBC: 3.7 10*3/uL (ref 3.4–10.8)

## 2021-11-24 LAB — LIPID PANEL
Chol/HDL Ratio: 3.2 ratio (ref 0.0–4.4)
Cholesterol, Total: 209 mg/dL — ABNORMAL HIGH (ref 100–199)
HDL: 66 mg/dL (ref >39)
LDL Chol Calc (NIH): 122 mg/dL — ABNORMAL HIGH (ref 0–99)
Triglycerides: 118 mg/dL (ref 0–149)
VLDL Cholesterol Cal: 21 mg/dL (ref 5–40)

## 2021-11-24 LAB — VITAMIN D 25 HYDROXY (VIT D DEFICIENCY, FRACTURES): Vit D, 25-Hydroxy: 62.2 ng/mL (ref 30.0–100.0)

## 2021-11-24 LAB — TSH+FREE T4
Free T4: 1.16 ng/dL (ref 0.82–1.77)
TSH: 0.706 u[IU]/mL (ref 0.450–4.500)

## 2021-11-25 DIAGNOSIS — Z170 Estrogen receptor positive status [ER+]: Secondary | ICD-10-CM | POA: Diagnosis not present

## 2021-11-25 DIAGNOSIS — C50811 Malignant neoplasm of overlapping sites of right female breast: Secondary | ICD-10-CM | POA: Diagnosis not present

## 2021-11-25 DIAGNOSIS — Z780 Asymptomatic menopausal state: Secondary | ICD-10-CM | POA: Diagnosis not present

## 2021-11-26 LAB — UA/M W/RFLX CULTURE, ROUTINE
Bilirubin, UA: NEGATIVE
Glucose, UA: NEGATIVE
Ketones, UA: NEGATIVE
Nitrite, UA: NEGATIVE
Protein,UA: NEGATIVE
RBC, UA: NEGATIVE
Specific Gravity, UA: 1.012 (ref 1.005–1.030)
Urobilinogen, Ur: 0.2 mg/dL (ref 0.2–1.0)
pH, UA: 5.5 (ref 5.0–7.5)

## 2021-11-26 LAB — MICROSCOPIC EXAMINATION
Bacteria, UA: NONE SEEN
Casts: NONE SEEN /lpf

## 2021-11-26 LAB — URINE CULTURE, REFLEX

## 2021-11-26 NOTE — Progress Notes (Signed)
Please call the patient and let her know her results: --CBC is normal, no anemia noted --metabolic panel is grossly normal.  --cholesterol panel is abnormal but still shows improvement in total cholesterol and LDL. VLDL, HDL and triglycerides are normal. Cholesterol/HDL ratio is 3.2 which is less than 1/2 the average risk of developing CVD.  --thyroid levels and vitamin D are normal

## 2021-11-29 ENCOUNTER — Telehealth: Payer: Self-pay

## 2021-11-29 NOTE — Telephone Encounter (Signed)
Spoke to pt, provided results  

## 2021-11-29 NOTE — Telephone Encounter (Signed)
-----   Message from Jonetta Osgood, NP sent at 11/26/2021  9:05 PM EDT ----- Please call the patient and let her know her results: --CBC is normal, no anemia noted --metabolic panel is grossly normal.  --cholesterol panel is abnormal but still shows improvement in total cholesterol and LDL. VLDL, HDL and triglycerides are normal. Cholesterol/HDL ratio is 3.2 which is less than 1/2 the average risk of developing CVD.  --thyroid levels and vitamin D are normal

## 2021-12-16 DIAGNOSIS — Z79811 Long term (current) use of aromatase inhibitors: Secondary | ICD-10-CM | POA: Diagnosis not present

## 2021-12-16 DIAGNOSIS — Z08 Encounter for follow-up examination after completed treatment for malignant neoplasm: Secondary | ICD-10-CM | POA: Diagnosis not present

## 2021-12-16 DIAGNOSIS — Z923 Personal history of irradiation: Secondary | ICD-10-CM | POA: Diagnosis not present

## 2021-12-16 DIAGNOSIS — R928 Other abnormal and inconclusive findings on diagnostic imaging of breast: Secondary | ICD-10-CM | POA: Diagnosis not present

## 2021-12-16 DIAGNOSIS — Z170 Estrogen receptor positive status [ER+]: Secondary | ICD-10-CM | POA: Diagnosis not present

## 2021-12-16 DIAGNOSIS — Z853 Personal history of malignant neoplasm of breast: Secondary | ICD-10-CM | POA: Diagnosis not present

## 2021-12-16 DIAGNOSIS — C50811 Malignant neoplasm of overlapping sites of right female breast: Secondary | ICD-10-CM | POA: Diagnosis not present

## 2021-12-27 DIAGNOSIS — Z170 Estrogen receptor positive status [ER+]: Secondary | ICD-10-CM | POA: Diagnosis not present

## 2021-12-27 DIAGNOSIS — C50811 Malignant neoplasm of overlapping sites of right female breast: Secondary | ICD-10-CM | POA: Diagnosis not present

## 2022-01-17 DIAGNOSIS — L723 Sebaceous cyst: Secondary | ICD-10-CM | POA: Diagnosis not present

## 2022-01-24 DIAGNOSIS — L723 Sebaceous cyst: Secondary | ICD-10-CM | POA: Diagnosis not present

## 2022-01-25 ENCOUNTER — Ambulatory Visit: Payer: Medicare Other | Admitting: Nurse Practitioner

## 2022-02-02 ENCOUNTER — Telehealth: Payer: Self-pay

## 2022-02-02 ENCOUNTER — Telehealth: Payer: Self-pay | Admitting: Gastroenterology

## 2022-02-02 DIAGNOSIS — Z1211 Encounter for screening for malignant neoplasm of colon: Secondary | ICD-10-CM

## 2022-02-02 MED ORDER — SUTAB 1479-225-188 MG PO TABS: 1.0000 | ORAL_TABLET | Freq: Once | ORAL | 0 refills | Status: AC

## 2022-02-02 NOTE — Telephone Encounter (Signed)
Rx sent to pharmacy for SuTab.  Patient aware.  Thanks,  [place], [place]

## 2022-02-02 NOTE — Telephone Encounter (Signed)
Patient needs a call back about her bowel prep and states she has questions about medications.

## 2022-02-07 ENCOUNTER — Telehealth: Payer: Self-pay | Admitting: *Deleted

## 2022-02-07 NOTE — Patient Outreach (Signed)
  Care Coordination   Initial Visit Note   02/07/2022 Name: Carolyn Reyes MRN: [id number] DOB: [birth year]  Carolyn Reyes Carolyn Reyes is a 76 y.o. year old female who sees Carolyn Guise, MD for primary care. I spoke with  Carolyn Reyes by phone today.  What matters to the patients health and wellness today?  No concerns expressed. RN discussed services [facility] services, RN, SW, and Pharmacist. Patient declined services.     Goals Addressed   None     SDOH assessments and interventions completed:  No     Care Coordination Interventions Activated:  Yes  Care Coordination Interventions:  No, not indicated   Follow up plan: No further intervention required.   Encounter Outcome:  Pt. [place] [facility] [phone]

## 2022-02-08 DIAGNOSIS — L720 Epidermal cyst: Secondary | ICD-10-CM | POA: Diagnosis not present

## 2022-02-08 DIAGNOSIS — L723 Sebaceous cyst: Secondary | ICD-10-CM | POA: Diagnosis not present

## 2022-02-11 ENCOUNTER — Ambulatory Visit: Payer: Medicare Other | Admitting: Anesthesiology

## 2022-02-11 ENCOUNTER — Encounter
Admission: RE | Disposition: A | Discharge status: Home / Self Care | Payer: Self-pay | Source: Ambulatory Visit | Attending: Gastroenterology

## 2022-02-11 ENCOUNTER — Ambulatory Visit
Admission: RE | Admit: 2022-02-11 | Discharge: 2022-02-11 | Disposition: A | Discharge status: Home / Self Care | Payer: Medicare Other | Source: Ambulatory Visit | Attending: Gastroenterology | Admitting: Gastroenterology

## 2022-02-11 DIAGNOSIS — D123 Benign neoplasm of transverse colon: Secondary | ICD-10-CM | POA: Diagnosis not present

## 2022-02-11 DIAGNOSIS — Z1211 Encounter for screening for malignant neoplasm of colon: Secondary | ICD-10-CM | POA: Insufficient documentation

## 2022-02-11 DIAGNOSIS — D125 Benign neoplasm of sigmoid colon: Secondary | ICD-10-CM | POA: Insufficient documentation

## 2022-02-11 DIAGNOSIS — I10 Essential (primary) hypertension: Secondary | ICD-10-CM | POA: Diagnosis not present

## 2022-02-11 DIAGNOSIS — Z853 Personal history of malignant neoplasm of breast: Secondary | ICD-10-CM | POA: Insufficient documentation

## 2022-02-11 DIAGNOSIS — K635 Polyp of colon: Secondary | ICD-10-CM | POA: Diagnosis not present

## 2022-02-11 DIAGNOSIS — E039 Hypothyroidism, unspecified: Secondary | ICD-10-CM | POA: Insufficient documentation

## 2022-02-11 DIAGNOSIS — K648 Other hemorrhoids: Secondary | ICD-10-CM | POA: Diagnosis not present

## 2022-02-11 DIAGNOSIS — K644 Residual hemorrhoidal skin tags: Secondary | ICD-10-CM | POA: Insufficient documentation

## 2022-02-11 HISTORY — PX: COLONOSCOPY WITH PROPOFOL: SHX5780

## 2022-02-11 SURGERY — COLONOSCOPY WITH PROPOFOL
Anesthesia: General

## 2022-02-11 MED ORDER — PROPOFOL 10 MG/ML IV BOLUS
INTRAVENOUS | Status: DC | PRN
  Administered 2022-02-11: 60 mg via INTRAVENOUS

## 2022-02-11 MED ORDER — PROPOFOL 500 MG/50ML IV EMUL
INTRAVENOUS | Status: DC | PRN
  Administered 2022-02-11: 140 ug/kg/min via INTRAVENOUS

## 2022-02-11 MED ORDER — SODIUM CHLORIDE 0.9 % IV SOLN
INTRAVENOUS | Status: DC
  Administered 2022-02-11: 20 mL/h via INTRAVENOUS

## 2022-02-11 MED ORDER — LIDOCAINE HCL (CARDIAC) PF 100 MG/5ML IV SOSY
PREFILLED_SYRINGE | INTRAVENOUS | Status: DC | PRN
  Administered 2022-02-11: 40 mg via INTRAVENOUS

## 2022-02-11 NOTE — H&P (Signed)
Cephas Darby, MD [address]  [place]  [place], [place] [address]  Main: [phone]  Fax: [phone] Pager: [phone]  Primary Care Physician:  Lavera Guise, MD Primary Gastroenterologist:  Dr. Cephas Darby  Pre-Procedure History & Physical: HPI:  Carolyn Reyes is a 76 y.o. female is here for an colonoscopy.   Past Medical History:  Diagnosis Date   Hypertension    Thyroid disease     Past Surgical History:  Procedure Laterality Date   BREAST SURGERY Left    FIBROID CYST REMOVED     Prior to Admission medications   Medication Sig Start Date End Date Taking? Authorizing Provider  amLODipine (NORVASC) 5 MG tablet Take 1 tablet (5 mg total) by mouth daily, at bedtime. 05/04/21  Yes Abernathy, Yetta Flock, NP  anastrozole (ARIMIDEX) 1 MG tablet Take 1 mg by mouth daily.   Yes [provider]  aspirin 81 MG chewable tablet Chew 81 mg by mouth daily.   Yes [provider]  Calcium Carbonate-Vit D-Min (CALCIUM 600+D3 PLUS MINERALS) 600-800 MG-UNIT TABS Take by mouth.   Yes [provider]  levothyroxine (SYNTHROID) 25 MCG tablet Take 1 tablet (25 mcg total) by mouth daily 1 hour before breakfast. 05/04/21  Yes Jonetta Osgood, NP  UNABLE TO FIND prn 11/19/18   [provider]    Allergies as of 11/23/2021   (No Known Allergies)    Family History  Problem Relation Age of Onset   Diabetes Mother    Hypertension Mother    Hypertension Father     Social History   Socioeconomic History   Marital status: Married    Spouse name: Not on file   Number of children: Not on file   Years of education: Not on file   Highest education level: Not on file  Occupational History   Not on file  Tobacco Use   Smoking status: Never   Smokeless tobacco: Never  Vaping Use   Vaping Use: Never used  Substance and Sexual Activity   Alcohol use: Never   Drug use: Never   Sexual activity: Not on file  Other Topics Concern   Not on  file  Social History Narrative   Not on file   Social Determinants of Health   Financial Resource Strain: Not on file  Food Insecurity: Not on file  Transportation Needs: Not on file  Physical Activity: Not on file  Stress: Not on file  Social Connections: Not on file  Intimate Partner Violence: Not on file    Review of Systems: See HPI, otherwise negative ROS  Physical Exam: BP (!) 141/81   Pulse 76   Temp (!) 96.5 F (35.8 C) (Temporal)   Resp 20   Ht '5\' 5"'$  (1.651 m)   Wt 68 kg   SpO2 100%   BMI 24.96 kg/m  General:   Alert,  pleasant and cooperative in NAD Head:  Normocephalic and atraumatic. Neck:  Supple; no masses or thyromegaly. Lungs:  Clear throughout to auscultation.    Heart:  Regular rate and rhythm. Abdomen:  Soft, nontender and nondistended. Normal bowel sounds, without guarding, and without rebound.   Neurologic:  Alert and  oriented x4;  grossly normal neurologically.  Impression/Plan: Carolyn Reyes is here for an colonoscopy to be performed for colon cancer screening  Risks, benefits, limitations, and alternatives regarding  colonoscopy have been reviewed with the patient.  Questions have been answered.  All parties agreeable.   Ellyssa Zagal  Marius Ditch, MD  02/11/2022, 8:38 AM

## 2022-02-11 NOTE — Op Note (Signed)
[facility] Patient Name: Carolyn Reyes Procedure Date: 02/11/2022 8:48 AM MRN: [id number] Account #: [id number] Date of Birth: December 23, 1945 Admit Type: Outpatient Age: 76 Room: [facility] ENDO ROOM 2 Gender: Female Note Status: Finalized Instrument Name: Jasper Riling [id number] Procedure:             Colonoscopy Indications:           Screening for colorectal malignant neoplasm, Last                         colonoscopy 10 years ago Providers:             Lin Landsman MD, MD Medicines:             General Anesthesia Complications:         No immediate complications. Estimated blood loss: None. Procedure:             Pre-Anesthesia Assessment:                        - Prior to the procedure, a History and Physical was                         performed, and patient medications and allergies were                         reviewed. The patient is competent. The risks and                         benefits of the procedure and the sedation options and                         risks were discussed with the patient. All questions                         were answered and informed consent was obtained.                         Patient identification and proposed procedure were                         verified by the physician, the nurse, the                         anesthesiologist, the anesthetist and the technician                         in the pre-procedure area in the procedure room in the                         endoscopy suite. Mental Status Examination: alert and                         oriented. Airway Examination: normal oropharyngeal                         airway and neck mobility. Respiratory Examination:                         clear to auscultation. CV  Examination: normal.                         Prophylactic Antibiotics: The patient does not require                         prophylactic antibiotics. Prior Anticoagulants: The                          patient has taken no previous anticoagulant or                         antiplatelet agents. ASA Grade Assessment: II - A                         patient with mild systemic disease. After reviewing                         the risks and benefits, the patient was deemed in                         satisfactory condition to undergo the procedure. The                         anesthesia plan was to use general anesthesia.                         Immediately prior to administration of medications,                         the patient was re-assessed for adequacy to receive                         sedatives. The heart rate, respiratory rate, oxygen                         saturations, blood pressure, adequacy of pulmonary                         ventilation, and response to care were monitored                         throughout the procedure. The physical status of the                         patient was re-assessed after the procedure.                        After obtaining informed consent, the colonoscope was                         passed under direct vision. Throughout the procedure,                         the patient's blood pressure, pulse, and oxygen                         saturations were monitored continuously. The  Colonoscope was introduced through the anus and                         advanced to the the terminal ileum, with                         identification of the appendiceal orifice and IC                         valve. The colonoscopy was performed without                         difficulty. The patient tolerated the procedure well.                         The quality of the bowel preparation was evaluated                         using the BBPS [facility] Bowel Preparation Scale) with                         scores of: Right Colon = 3, Transverse Colon = 3 and                         Left Colon = 3 (entire mucosa seen well with no                          residual staining, small fragments of stool or opaque                         liquid). The total BBPS score equals 9. Findings:      The perianal and digital rectal examinations were normal. Pertinent       negatives include normal sphincter tone and no palpable rectal lesions.      The terminal ileum appeared normal.      Two sessile polyps were found in the sigmoid colon and transverse colon.       The polyps were 3 to 5 mm in size. These polyps were removed with a cold       snare. Resection and retrieval were complete. Estimated blood loss: none.      Non-bleeding external and internal hemorrhoids were found during       retroflexion. The hemorrhoids were medium-sized. Impression:            - The examined portion of the ileum was normal.                        - Two 3 to 5 mm polyps in the sigmoid colon and in the                         transverse colon, removed with a cold snare. Resected                         and retrieved.                        - Non-bleeding external and internal hemorrhoids. Recommendation:        -  Discharge patient to home (with escort).                        - Resume previous diet today.                        - Continue present medications.                        - Await pathology results.                        - Repeat colonoscopy in 7-10 years for surveillance                         based on pathology results. Procedure Code(s):     --- Professional ---                        [phone], Colonoscopy, flexible; with removal of                         tumor(s), polyp(s), or other lesion(s) by snare                         technique Diagnosis Code(s):     --- Professional ---                        Z12.11, Encounter for screening for malignant neoplasm                         of colon                        K64.8, Other hemorrhoids                        K63.5, Polyp of colon CPT copyright 2019 American Medical Association. All rights reserved. The  codes documented in this report are preliminary and upon coder review may  be revised to meet current compliance requirements. Dr. Ulyess Mort Lin Landsman MD, MD 02/11/2022 9:10:10 AM This report has been signed electronically. Number of Addenda: 0 Note Initiated On: 02/11/2022 8:48 AM Scope Withdrawal Time: 0 hours 8 minutes 50 seconds  Total Procedure Duration: 0 hours 12 minutes 7 seconds  Estimated Blood Loss:  Estimated blood loss: none.      [facility]

## 2022-02-11 NOTE — Transfer of Care (Signed)
Immediate Anesthesia Transfer of Care Note  Patient: Carolyn Reyes  Procedure(s) Performed: COLONOSCOPY WITH PROPOFOL  Patient Location: PACU  Anesthesia Type:General  Level of Consciousness: awake, alert  and oriented  Airway & Oxygen Therapy: Patient connected to nasal cannula oxygen  Post-op Assessment: Report given to RN and Post -op Vital signs reviewed and stable  Post vital signs: Reviewed and stable  Last Vitals:  Vitals Value Taken Time  BP 107/71 02/11/22 0913  Temp 35.6 C 02/11/22 0913  Pulse 82 02/11/22 0920  Resp 22 02/11/22 0920  SpO2 100 % 02/11/22 0920  Vitals shown include unvalidated device data.  Last Pain:  Vitals:   02/11/22 0913  TempSrc: Temporal  PainSc: Asleep         Complications: No notable events documented.

## 2022-02-11 NOTE — Anesthesia Preprocedure Evaluation (Signed)
Anesthesia Evaluation  Patient identified by MRN, date of birth, ID band Patient awake    Reviewed: Allergy & Precautions, NPO status , Patient's Chart, lab work & pertinent test results  Airway Mallampati: II  TM Distance: >3 FB Neck ROM: Full    Dental no notable dental hx.    Pulmonary neg pulmonary ROS,    Pulmonary exam normal breath sounds clear to auscultation       Cardiovascular hypertension, negative cardio ROS Normal cardiovascular exam Rhythm:Regular Rate:Normal     Neuro/Psych negative neurological ROS  negative psych ROS   GI/Hepatic negative GI ROS, Neg liver ROS,   Endo/Other  Hypothyroidism   Renal/GU negative Renal ROS  negative genitourinary   Musculoskeletal Hx of breast cancer s/p mastectomy     Abdominal   Peds negative pediatric ROS (+)  Hematology negative hematology ROS (+)   Anesthesia Other Findings   Reproductive/Obstetrics negative OB ROS                             Anesthesia Physical Anesthesia Plan  ASA: 2  Anesthesia Plan: General   Post-op Pain Management: Minimal or no pain anticipated   Induction: Intravenous  PONV Risk Score and Plan: 3 and Propofol infusion and TIVA  Airway Management Planned: Natural Airway and Nasal Cannula  Additional Equipment:   Intra-op Plan:   Post-operative Plan:   Informed Consent: I have reviewed the patients History and Physical, chart, labs and discussed the procedure including the risks, benefits and alternatives for the proposed anesthesia with the patient or authorized representative who has indicated his/her understanding and acceptance.     Dental Advisory Given  Plan Discussed with: Anesthesiologist, CRNA and Surgeon  Anesthesia Plan Comments: (Patient consented for risks of anesthesia including but not limited to:  - adverse reactions to medications - risk of airway placement if required -  damage to eyes, teeth, lips or other oral mucosa - nerve damage due to positioning  - sore throat or hoarseness - Damage to heart, brain, nerves, lungs, other parts of body or loss of life  Patient voiced understanding.)        Anesthesia Quick Evaluation

## 2022-02-11 NOTE — Anesthesia Postprocedure Evaluation (Signed)
Anesthesia Post Note  Patient: Carolyn Reyes  Procedure(s) Performed: COLONOSCOPY WITH PROPOFOL  Patient location during evaluation: Endoscopy Anesthesia Type: General Level of consciousness: awake and alert Pain management: pain level controlled Vital Signs Assessment: post-procedure vital signs reviewed and stable Respiratory status: spontaneous breathing, nonlabored ventilation, respiratory function stable and patient connected to nasal cannula oxygen Cardiovascular status: blood pressure returned to baseline and stable Postop Assessment: no apparent nausea or vomiting Anesthetic complications: no   No notable events documented.   Last Vitals:  Vitals:   02/11/22 0913 02/11/22 0933  BP: 107/71   Pulse:    Resp:    Temp: (!) 35.6 C   SpO2:  100%    Last Pain:  Vitals:   02/11/22 0933  TempSrc:   PainSc: 0-No pain                 Ilene Qua

## 2022-02-14 ENCOUNTER — Encounter: Payer: Self-pay | Admitting: Gastroenterology

## 2022-02-14 DIAGNOSIS — Z09 Encounter for follow-up examination after completed treatment for conditions other than malignant neoplasm: Secondary | ICD-10-CM | POA: Diagnosis not present

## 2022-02-14 DIAGNOSIS — L723 Sebaceous cyst: Secondary | ICD-10-CM | POA: Diagnosis not present

## 2022-02-14 LAB — SURGICAL PATHOLOGY

## 2022-03-11 ENCOUNTER — Other Ambulatory Visit: Payer: Self-pay | Admitting: Nurse Practitioner

## 2022-03-11 DIAGNOSIS — E782 Mixed hyperlipidemia: Secondary | ICD-10-CM

## 2022-03-11 DIAGNOSIS — M545 Low back pain, unspecified: Secondary | ICD-10-CM

## 2022-03-11 DIAGNOSIS — Z170 Estrogen receptor positive status [ER+]: Secondary | ICD-10-CM

## 2022-03-11 DIAGNOSIS — I10 Essential (primary) hypertension: Secondary | ICD-10-CM

## 2022-03-11 DIAGNOSIS — Z1231 Encounter for screening mammogram for malignant neoplasm of breast: Secondary | ICD-10-CM

## 2022-03-11 DIAGNOSIS — Z0189 Encounter for other specified special examinations: Secondary | ICD-10-CM

## 2022-03-11 DIAGNOSIS — E039 Hypothyroidism, unspecified: Secondary | ICD-10-CM

## 2022-03-11 DIAGNOSIS — Z0001 Encounter for general adult medical examination with abnormal findings: Secondary | ICD-10-CM

## 2022-03-11 DIAGNOSIS — E559 Vitamin D deficiency, unspecified: Secondary | ICD-10-CM

## 2022-04-07 DIAGNOSIS — E349 Endocrine disorder, unspecified: Secondary | ICD-10-CM | POA: Diagnosis not present

## 2022-04-07 DIAGNOSIS — M8588 Other specified disorders of bone density and structure, other site: Secondary | ICD-10-CM | POA: Diagnosis not present

## 2022-04-07 DIAGNOSIS — Z780 Asymptomatic menopausal state: Secondary | ICD-10-CM | POA: Diagnosis not present

## 2022-04-07 DIAGNOSIS — M8589 Other specified disorders of bone density and structure, multiple sites: Secondary | ICD-10-CM | POA: Diagnosis not present

## 2022-04-07 DIAGNOSIS — M85852 Other specified disorders of bone density and structure, left thigh: Secondary | ICD-10-CM | POA: Diagnosis not present

## 2022-04-14 DIAGNOSIS — C50811 Malignant neoplasm of overlapping sites of right female breast: Secondary | ICD-10-CM | POA: Diagnosis not present

## 2022-04-14 DIAGNOSIS — Z780 Asymptomatic menopausal state: Secondary | ICD-10-CM | POA: Diagnosis not present

## 2022-04-14 DIAGNOSIS — Z170 Estrogen receptor positive status [ER+]: Secondary | ICD-10-CM | POA: Diagnosis not present

## 2022-08-18 DIAGNOSIS — Z170 Estrogen receptor positive status [ER+]: Secondary | ICD-10-CM | POA: Diagnosis not present

## 2022-08-18 DIAGNOSIS — C50811 Malignant neoplasm of overlapping sites of right female breast: Secondary | ICD-10-CM | POA: Diagnosis not present

## 2022-08-30 ENCOUNTER — Ambulatory Visit: Payer: Medicare Other | Admitting: Nurse Practitioner

## 2022-09-19 DIAGNOSIS — H524 Presbyopia: Secondary | ICD-10-CM | POA: Diagnosis not present

## 2022-09-19 DIAGNOSIS — H2513 Age-related nuclear cataract, bilateral: Secondary | ICD-10-CM | POA: Diagnosis not present

## 2022-11-29 ENCOUNTER — Encounter: Payer: Self-pay | Admitting: Nurse Practitioner

## 2022-11-29 ENCOUNTER — Ambulatory Visit (INDEPENDENT_AMBULATORY_CARE_PROVIDER_SITE_OTHER): Payer: Medicare Other | Admitting: Nurse Practitioner

## 2022-11-29 VITALS — BP 120/74 | HR 78 | Temp 98.6°F | Resp 16 | Ht 65.5 in | Wt 149.8 lb

## 2022-11-29 DIAGNOSIS — E782 Mixed hyperlipidemia: Secondary | ICD-10-CM | POA: Diagnosis not present

## 2022-11-29 DIAGNOSIS — R300 Dysuria: Secondary | ICD-10-CM

## 2022-11-29 DIAGNOSIS — Z0001 Encounter for general adult medical examination with abnormal findings: Secondary | ICD-10-CM

## 2022-11-29 DIAGNOSIS — E559 Vitamin D deficiency, unspecified: Secondary | ICD-10-CM | POA: Diagnosis not present

## 2022-11-29 DIAGNOSIS — E039 Hypothyroidism, unspecified: Secondary | ICD-10-CM

## 2022-11-29 NOTE — Progress Notes (Signed)
[facility] [address] [place], [place] [address]  Internal MEDICINE  Office Visit Note  Patient Name: Carolyn Reyes  [id number]  [id number]  Date of Service: 11/29/2022  Chief Complaint  Patient presents with   Hypertension   Medicare Wellness    HPI Richard presents for an annual well visit and physical exam.  Well-appearing 77 y.o. female with  Routine CRC screening: due in 2030 Routine mammogram:scheduled for July 29  DEXA scan: done in 2013 Labs: due for routine labs.  New or worsening pain: none  No significant changes since last year's AWV.       11/29/2022    8:58 AM 11/23/2021    9:09 AM 11/19/2020    8:36 AM  MMSE - Mini Mental State Exam  Orientation to time 5 5 5   Orientation to Place 5 5 5   Registration 3 3 3   Attention/ Calculation 5 5 5   Recall 3 3 3   Language- name 2 objects 2 2 2   Language- repeat 1 1 1   Language- follow 3 step command 3 3 3   Language- read & follow direction 1 1 1   Write a sentence 1 1 1   Copy design 1 1 1   Total score 30 30 30     Functional Status Survey: Is the patient deaf or have difficulty hearing?: No Does the patient have difficulty seeing, even when wearing glasses/contacts?: No Does the patient have difficulty concentrating, remembering, or making decisions?: No Does the patient have difficulty walking or climbing stairs?: No Does the patient have difficulty dressing or bathing?: No Does the patient have difficulty doing errands alone such as visiting a doctor's office or shopping?: No     11/19/2019    9:22 AM 11/19/2020    8:35 AM 11/23/2021    9:07 AM 02/11/2022    8:31 AM 11/29/2022    8:57 AM  Fall Risk  Falls in the past year? 0 0 0  0  Was there an injury with Fall?     0  Fall Risk Category Calculator     0  (RETIRED) Patient Fall Risk Level   Low fall risk Low fall risk   Patient at Risk for Falls Due to   No Fall Risks  No Fall Risks  Fall risk Follow up   Falls evaluation completed  Falls  evaluation completed       11/29/2022    8:57 AM  Depression screen PHQ 2/9  Decreased Interest 0  Down, Depressed, Hopeless 0  PHQ - 2 Score 0       Current Medication: Outpatient Encounter Medications as of 11/29/2022  Medication Sig   amLODipine (NORVASC) 5 MG tablet TAKE 1 TABLET BY MOUTH DAILY AT  BEDTIME   anastrozole (ARIMIDEX) 1 MG tablet Take 1 mg by mouth daily.   aspirin 81 MG chewable tablet Chew 81 mg by mouth daily.   Calcium Carbonate-Vit D-Min (CALCIUM 600+D3 PLUS MINERALS) 600-800 MG-UNIT TABS Take by mouth.   levothyroxine (SYNTHROID) 25 MCG tablet TAKE 1 TABLET BY MOUTH DAILY 1  HOUR BEFORE BREAKFAST   UNABLE TO FIND prn   No facility-administered encounter medications on file as of 11/29/2022.    Surgical History: Past Surgical History:  Procedure Laterality Date   BREAST SURGERY Left    FIBROID CYST REMOVED    COLONOSCOPY WITH PROPOFOL N/A 02/11/2022   Procedure: COLONOSCOPY WITH PROPOFOL;  Surgeon: Toney Reil, MD;  Location: [facility] END[facility]COPY;  Service: Gastroenterology;  Laterality: N/A;  Medical History: Past Medical History:  Diagnosis Date   Hypertension    Thyroid disease     Family History: Family History  Problem Relation Age of Onset   Diabetes Mother    Hypertension Mother    Hypertension Father     Social History   Socioeconomic History   Marital status: Married    Spouse name: Not on file   Number of children: Not on file   Years of education: Not on file   Highest education level: Not on file  Occupational History   Not on file  Tobacco Use   Smoking status: Never   Smokeless tobacco: Never  Vaping Use   Vaping Use: Never used  Substance and Sexual Activity   Alcohol use: Never   Drug use: Never   Sexual activity: Not on file  Other Topics Concern   Not on file  Social History Narrative   Not on file   Social Determinants of Health   Financial Resource Strain: Not on file  Food Insecurity: Not on file   Transportation Needs: Not on file  Physical Activity: Not on file  Stress: Not on file  Social Connections: Not on file  Intimate Partner Violence: Not on file      Review of Systems  Constitutional:  Negative for activity change, appetite change, chills, fatigue, fever and unexpected weight change.  HENT: Negative.  Negative for congestion, ear pain, rhinorrhea, sore throat and trouble swallowing.   Eyes: Negative.   Respiratory: Negative.  Negative for cough, chest tightness, shortness of breath and wheezing.   Cardiovascular: Negative.  Negative for chest pain.  Gastrointestinal: Negative.  Negative for abdominal pain, blood in stool, constipation, diarrhea, nausea and vomiting.  Endocrine: Negative.   Genitourinary: Negative.  Negative for difficulty urinating, dysuria, frequency, hematuria and urgency.  Musculoskeletal: Negative.  Negative for arthralgias, back pain, joint swelling, myalgias and neck pain.  Skin: Negative.  Negative for rash and wound.  Allergic/Immunologic: Negative.  Negative for immunocompromised state.  Neurological: Negative.  Negative for dizziness, seizures, numbness and headaches.  Hematological: Negative.   Psychiatric/Behavioral: Negative.  Negative for behavioral problems, self-injury and suicidal ideas. The patient is not nervous/anxious.     Vital Signs: BP 120/74   Pulse 78   Temp 98.6 F (37 C)   Resp 16   Ht 5' 5.5" (1.664 m)   Wt 149 lb 12.8 oz (67.9 kg)   SpO2 96%   BMI 24.55 kg/m    Physical Exam Vitals reviewed.  Constitutional:      General: She is awake. She is not in acute distress.    Appearance: Normal appearance. She is well-developed, well-groomed and normal weight. She is not ill-appearing or diaphoretic.  HENT:     Head: Normocephalic and atraumatic.     Right Ear: Tympanic membrane, ear canal and external ear normal.     Left Ear: Tympanic membrane, ear canal and external ear normal.     Nose: Nose normal. No  congestion or rhinorrhea.     Mouth/Throat:     Lips: Pink.     Mouth: Mucous membranes are moist.     Pharynx: Oropharynx is clear. Uvula midline. No oropharyngeal exudate or posterior oropharyngeal erythema.  Eyes:     General: Lids are normal. Vision grossly intact. Gaze aligned appropriately.     Extraocular Movements: Extraocular movements intact.     Conjunctiva/sclera: Conjunctivae normal.     Pupils: Pupils are equal, round, and reactive to light.  Funduscopic exam:    Right eye: Red reflex present.        Left eye: Red reflex present. Neck:     Thyroid: No thyromegaly.     Vascular: No carotid bruit or JVD.     Trachea: Trachea and phonation normal. No tracheal deviation.  Cardiovascular:     Rate and Rhythm: Normal rate and regular rhythm.     Pulses: Normal pulses.     Heart sounds: Normal heart sounds, S1 normal and S2 normal. No murmur heard.    No friction rub. No gallop.  Pulmonary:     Effort: Pulmonary effort is normal. No accessory muscle usage or respiratory distress.     Breath sounds: Normal breath sounds and air entry. No wheezing or rales.  Chest:     Chest wall: No tenderness.  Breasts:    Right: Normal. No swelling, bleeding, inverted nipple, mass, nipple discharge, skin change or tenderness.     Left: Normal. No swelling, bleeding, inverted nipple, mass, nipple discharge, skin change or tenderness.  Abdominal:     General: Bowel sounds are normal.     Palpations: Abdomen is soft. There is no shifting dullness, fluid wave, mass or pulsatile mass.     Tenderness: There is no abdominal tenderness.  Musculoskeletal:        General: Normal range of motion.     Cervical back: Normal range of motion and neck supple.     Right lower leg: No edema.     Left lower leg: No edema.  Lymphadenopathy:     Cervical: No cervical adenopathy.     Upper Body:     Right upper body: No supraclavicular, axillary or pectoral adenopathy.     Left upper body: No  supraclavicular, axillary or pectoral adenopathy.  Skin:    General: Skin is warm and dry.     Capillary Refill: Capillary refill takes less than 2 seconds.  Neurological:     Mental Status: She is alert and oriented to person, place, and time.     Cranial Nerves: No cranial nerve deficit.  Psychiatric:        Mood and Affect: Mood normal.        Behavior: Behavior normal. Behavior is cooperative.        Thought Content: Thought content normal.        Judgment: Judgment normal.        Assessment/Plan: 1. Encounter for routine adult health examination with abnormal findings Age-appropriate preventive screenings and vaccinations discussed, annual physical exam completed. Routine labs for health maintenance ordered, see below. PHM updated.  - CBC with Differential/Platelet - CMP14+EGFR - Lipid Profile - Vitamin D (25 hydroxy) - TSH + free T4  2. Acquired hypothyroidism Routine labs ordered  - CMP14+EGFR - Lipid Profile - TSH + free T4  3. Mixed hyperlipidemia Routine labs ordered  - CBC with Differential/Platelet - CMP14+EGFR - Lipid Profile - TSH + free T4  4. Vitamin D deficiency Routine lab ordered  - Vitamin D (25 hydroxy)  5. Dysuria Routine urinalysis done  - UA/M w/rflx Culture, Routine     General Counseling: Betzabe verbalizes understanding of the findings of todays visit and agrees with plan of treatment. I have discussed any further diagnostic evaluation that may be needed or ordered today. We also reviewed her medications today. she has been encouraged to call the office with any questions or concerns that should arise related to todays visit.    Orders Placed This Encounter  Procedures   UA/M w/rflx Culture, Routine   CBC with Differential/Platelet   CMP14+EGFR   Lipid Profile   Vitamin D (25 hydroxy)   TSH + free T4    No orders of the defined types were placed in this encounter.   Return in about 1 year (around 11/29/2023) for AWV, Shrika Milos  PCP and otherwise as needed. .   Total time spent:30 Minutes Time spent includes review of chart, medications, test results, and follow up plan with the patient.   [place] Controlled Substance Database was reviewed by me.  This patient was seen by Sallyanne Kuster, FNP-C in collaboration with Dr. Beverely Risen as a part of collaborative care agreement.  Kylie Simmonds R. Tedd Sias, MSN, FNP-C Internal medicine

## 2022-11-30 ENCOUNTER — Telehealth: Payer: Self-pay

## 2022-11-30 LAB — CMP14+EGFR
ALT: 21 IU/L (ref 0–32)
AST: 25 IU/L (ref 0–40)
Albumin: 4.5 g/dL (ref 3.8–4.8)
Alkaline Phosphatase: 124 IU/L — ABNORMAL HIGH (ref 44–121)
BUN/Creatinine Ratio: 15 (ref 12–28)
BUN: 11 mg/dL (ref 8–27)
Bilirubin Total: 0.4 mg/dL (ref 0.0–1.2)
CO2: 23 mmol/L (ref 20–29)
Calcium: 10.4 mg/dL — ABNORMAL HIGH (ref 8.7–10.3)
Chloride: 106 mmol/L (ref 96–106)
Creatinine, Ser: 0.72 mg/dL (ref 0.57–1.00)
Globulin, Total: 2.3 g/dL (ref 1.5–4.5)
Glucose: 103 mg/dL — ABNORMAL HIGH (ref 70–99)
Potassium: 4.2 mmol/L (ref 3.5–5.2)
Sodium: 142 mmol/L (ref 134–144)
Total Protein: 6.8 g/dL (ref 6.0–8.5)
eGFR: 87 mL/min/{1.73_m2} (ref >59)

## 2022-11-30 LAB — MICROSCOPIC EXAMINATION
Bacteria, UA: NONE SEEN
Casts: NONE SEEN /lpf
Epithelial Cells (non renal): NONE SEEN /hpf (ref 0–10)
RBC, Urine: NONE SEEN /hpf (ref 0–2)

## 2022-11-30 LAB — TSH+FREE T4
Free T4: 1.19 ng/dL (ref 0.82–1.77)
TSH: 0.65 u[IU]/mL (ref 0.450–4.500)

## 2022-11-30 LAB — CBC WITH DIFFERENTIAL/PLATELET
Basophils Absolute: 0 10*3/uL (ref 0.0–0.2)
Basos: 0 %
EOS (ABSOLUTE): 0.1 10*3/uL (ref 0.0–0.4)
Eos: 3 %
Hematocrit: 40.4 % (ref 34.0–46.6)
Hemoglobin: 13.6 g/dL (ref 11.1–15.9)
Immature Grans (Abs): 0 10*3/uL (ref 0.0–0.1)
Immature Granulocytes: 0 %
Lymphocytes Absolute: 1.1 10*3/uL (ref 0.7–3.1)
Lymphs: 30 %
MCH: 29.5 pg (ref 26.6–33.0)
MCHC: 33.7 g/dL (ref 31.5–35.7)
MCV: 88 fL (ref 79–97)
Monocytes Absolute: 0.3 10*3/uL (ref 0.1–0.9)
Monocytes: 7 %
Neutrophils Absolute: 2.1 10*3/uL (ref 1.4–7.0)
Neutrophils: 60 %
Platelets: 261 10*3/uL (ref 150–450)
RBC: 4.61 x10E6/uL (ref 3.77–5.28)
RDW: 13 % (ref 11.7–15.4)
WBC: 3.5 10*3/uL (ref 3.4–10.8)

## 2022-11-30 LAB — LIPID PANEL
Chol/HDL Ratio: 3.1 ratio (ref 0.0–4.4)
Cholesterol, Total: 219 mg/dL — ABNORMAL HIGH (ref 100–199)
HDL: 71 mg/dL (ref >39)
LDL Chol Calc (NIH): 130 mg/dL — ABNORMAL HIGH (ref 0–99)
Triglycerides: 105 mg/dL (ref 0–149)
VLDL Cholesterol Cal: 18 mg/dL (ref 5–40)

## 2022-11-30 LAB — UA/M W/RFLX CULTURE, ROUTINE
Bilirubin, UA: NEGATIVE
Glucose, UA: NEGATIVE
Ketones, UA: NEGATIVE
Leukocytes,UA: NEGATIVE
Nitrite, UA: NEGATIVE
Protein,UA: NEGATIVE
RBC, UA: NEGATIVE
Specific Gravity, UA: 1.011 (ref 1.005–1.030)
Urobilinogen, Ur: 0.2 mg/dL (ref 0.2–1.0)
pH, UA: 6.5 (ref 5.0–7.5)

## 2022-11-30 LAB — VITAMIN D 25 HYDROXY (VIT D DEFICIENCY, FRACTURES): Vit D, 25-Hydroxy: 59.6 ng/mL (ref 30.0–100.0)

## 2022-11-30 NOTE — Telephone Encounter (Signed)
Patient notified

## 2022-11-30 NOTE — Progress Notes (Signed)
Labs are grossly normal except for elevated LDL and total cholesterol. --recommend limiting red meat, increase lean proteins and take OTC fish oil or flaxseed oil supplement.

## 2022-11-30 NOTE — Telephone Encounter (Signed)
-----   Message from Sallyanne Kuster, NP sent at 11/30/2022  7:21 AM EDT ----- Labs are grossly normal except for elevated LDL and total cholesterol. --recommend limiting red meat, increase lean proteins and take OTC fish oil or flaxseed oil supplement.

## 2022-12-26 DIAGNOSIS — Z08 Encounter for follow-up examination after completed treatment for malignant neoplasm: Secondary | ICD-10-CM | POA: Diagnosis not present

## 2022-12-26 DIAGNOSIS — Z9011 Acquired absence of right breast and nipple: Secondary | ICD-10-CM | POA: Diagnosis not present

## 2022-12-26 DIAGNOSIS — Z79811 Long term (current) use of aromatase inhibitors: Secondary | ICD-10-CM | POA: Diagnosis not present

## 2022-12-26 DIAGNOSIS — Z1231 Encounter for screening mammogram for malignant neoplasm of breast: Secondary | ICD-10-CM | POA: Diagnosis not present

## 2022-12-26 DIAGNOSIS — Z9221 Personal history of antineoplastic chemotherapy: Secondary | ICD-10-CM | POA: Diagnosis not present

## 2022-12-26 DIAGNOSIS — Z170 Estrogen receptor positive status [ER+]: Secondary | ICD-10-CM | POA: Diagnosis not present

## 2022-12-26 DIAGNOSIS — C50811 Malignant neoplasm of overlapping sites of right female breast: Secondary | ICD-10-CM | POA: Diagnosis not present

## 2022-12-26 DIAGNOSIS — Z853 Personal history of malignant neoplasm of breast: Secondary | ICD-10-CM | POA: Diagnosis not present

## 2022-12-26 DIAGNOSIS — Z923 Personal history of irradiation: Secondary | ICD-10-CM | POA: Diagnosis not present

## 2023-03-31 ENCOUNTER — Other Ambulatory Visit: Payer: Self-pay | Admitting: Nurse Practitioner

## 2023-03-31 DIAGNOSIS — Z0001 Encounter for general adult medical examination with abnormal findings: Secondary | ICD-10-CM

## 2023-03-31 DIAGNOSIS — Z1231 Encounter for screening mammogram for malignant neoplasm of breast: Secondary | ICD-10-CM

## 2023-03-31 DIAGNOSIS — E559 Vitamin D deficiency, unspecified: Secondary | ICD-10-CM

## 2023-03-31 DIAGNOSIS — C50811 Malignant neoplasm of overlapping sites of right female breast: Secondary | ICD-10-CM

## 2023-03-31 DIAGNOSIS — Z0189 Encounter for other specified special examinations: Secondary | ICD-10-CM

## 2023-03-31 DIAGNOSIS — M545 Low back pain, unspecified: Secondary | ICD-10-CM

## 2023-03-31 DIAGNOSIS — I10 Essential (primary) hypertension: Secondary | ICD-10-CM

## 2023-03-31 DIAGNOSIS — E039 Hypothyroidism, unspecified: Secondary | ICD-10-CM

## 2023-03-31 DIAGNOSIS — E782 Mixed hyperlipidemia: Secondary | ICD-10-CM

## 2023-04-08 ENCOUNTER — Other Ambulatory Visit: Payer: Self-pay | Admitting: Nurse Practitioner

## 2023-04-08 DIAGNOSIS — Z1231 Encounter for screening mammogram for malignant neoplasm of breast: Secondary | ICD-10-CM

## 2023-04-08 DIAGNOSIS — E782 Mixed hyperlipidemia: Secondary | ICD-10-CM

## 2023-04-08 DIAGNOSIS — Z0001 Encounter for general adult medical examination with abnormal findings: Secondary | ICD-10-CM

## 2023-04-08 DIAGNOSIS — Z0189 Encounter for other specified special examinations: Secondary | ICD-10-CM

## 2023-04-08 DIAGNOSIS — M545 Low back pain, unspecified: Secondary | ICD-10-CM

## 2023-04-08 DIAGNOSIS — E039 Hypothyroidism, unspecified: Secondary | ICD-10-CM

## 2023-04-08 DIAGNOSIS — Z170 Estrogen receptor positive status [ER+]: Secondary | ICD-10-CM

## 2023-04-08 DIAGNOSIS — E559 Vitamin D deficiency, unspecified: Secondary | ICD-10-CM

## 2023-04-08 DIAGNOSIS — I10 Essential (primary) hypertension: Secondary | ICD-10-CM

## 2023-04-13 DIAGNOSIS — Z808 Family history of malignant neoplasm of other organs or systems: Secondary | ICD-10-CM | POA: Diagnosis not present

## 2023-04-13 DIAGNOSIS — Z803 Family history of malignant neoplasm of breast: Secondary | ICD-10-CM | POA: Diagnosis not present

## 2023-04-13 DIAGNOSIS — Z853 Personal history of malignant neoplasm of breast: Secondary | ICD-10-CM | POA: Diagnosis not present

## 2023-04-13 DIAGNOSIS — Z08 Encounter for follow-up examination after completed treatment for malignant neoplasm: Secondary | ICD-10-CM | POA: Diagnosis not present

## 2023-04-13 DIAGNOSIS — Z9221 Personal history of antineoplastic chemotherapy: Secondary | ICD-10-CM | POA: Diagnosis not present

## 2023-04-13 DIAGNOSIS — M8589 Other specified disorders of bone density and structure, multiple sites: Secondary | ICD-10-CM | POA: Diagnosis not present

## 2023-04-13 DIAGNOSIS — Z170 Estrogen receptor positive status [ER+]: Secondary | ICD-10-CM | POA: Diagnosis not present

## 2023-04-13 DIAGNOSIS — Z923 Personal history of irradiation: Secondary | ICD-10-CM | POA: Diagnosis not present

## 2023-04-13 DIAGNOSIS — Z79811 Long term (current) use of aromatase inhibitors: Secondary | ICD-10-CM | POA: Diagnosis not present

## 2023-04-13 DIAGNOSIS — Z780 Asymptomatic menopausal state: Secondary | ICD-10-CM | POA: Diagnosis not present

## 2023-04-13 DIAGNOSIS — C50811 Malignant neoplasm of overlapping sites of right female breast: Secondary | ICD-10-CM | POA: Diagnosis not present

## 2023-04-13 DIAGNOSIS — M810 Age-related osteoporosis without current pathological fracture: Secondary | ICD-10-CM | POA: Diagnosis not present

## 2023-04-13 DIAGNOSIS — Z801 Family history of malignant neoplasm of trachea, bronchus and lung: Secondary | ICD-10-CM | POA: Diagnosis not present

## 2023-04-13 DIAGNOSIS — Z9011 Acquired absence of right breast and nipple: Secondary | ICD-10-CM | POA: Diagnosis not present

## 2023-08-17 DIAGNOSIS — Z170 Estrogen receptor positive status [ER+]: Secondary | ICD-10-CM | POA: Diagnosis not present

## 2023-08-17 DIAGNOSIS — C50811 Malignant neoplasm of overlapping sites of right female breast: Secondary | ICD-10-CM | POA: Diagnosis not present

## 2023-11-12 ENCOUNTER — Other Ambulatory Visit: Payer: Self-pay | Admitting: Nurse Practitioner

## 2023-11-12 DIAGNOSIS — Z1231 Encounter for screening mammogram for malignant neoplasm of breast: Secondary | ICD-10-CM

## 2023-11-12 DIAGNOSIS — I10 Essential (primary) hypertension: Secondary | ICD-10-CM

## 2023-11-12 DIAGNOSIS — E039 Hypothyroidism, unspecified: Secondary | ICD-10-CM

## 2023-11-12 DIAGNOSIS — Z0001 Encounter for general adult medical examination with abnormal findings: Secondary | ICD-10-CM

## 2023-11-12 DIAGNOSIS — Z0189 Encounter for other specified special examinations: Secondary | ICD-10-CM

## 2023-11-12 DIAGNOSIS — E782 Mixed hyperlipidemia: Secondary | ICD-10-CM

## 2023-11-12 DIAGNOSIS — M545 Low back pain, unspecified: Secondary | ICD-10-CM

## 2023-11-12 DIAGNOSIS — C50811 Malignant neoplasm of overlapping sites of right female breast: Secondary | ICD-10-CM

## 2023-11-12 DIAGNOSIS — E559 Vitamin D deficiency, unspecified: Secondary | ICD-10-CM

## 2023-11-24 ENCOUNTER — Other Ambulatory Visit: Payer: Self-pay | Admitting: Nurse Practitioner

## 2023-11-24 DIAGNOSIS — Z0001 Encounter for general adult medical examination with abnormal findings: Secondary | ICD-10-CM

## 2023-11-24 DIAGNOSIS — E039 Hypothyroidism, unspecified: Secondary | ICD-10-CM

## 2023-11-24 DIAGNOSIS — I10 Essential (primary) hypertension: Secondary | ICD-10-CM

## 2023-11-24 DIAGNOSIS — M545 Low back pain, unspecified: Secondary | ICD-10-CM

## 2023-11-24 DIAGNOSIS — C50811 Malignant neoplasm of overlapping sites of right female breast: Secondary | ICD-10-CM

## 2023-11-24 DIAGNOSIS — E559 Vitamin D deficiency, unspecified: Secondary | ICD-10-CM

## 2023-11-24 DIAGNOSIS — Z0189 Encounter for other specified special examinations: Secondary | ICD-10-CM

## 2023-11-24 DIAGNOSIS — E782 Mixed hyperlipidemia: Secondary | ICD-10-CM

## 2023-11-24 DIAGNOSIS — Z1231 Encounter for screening mammogram for malignant neoplasm of breast: Secondary | ICD-10-CM

## 2023-11-29 DIAGNOSIS — H524 Presbyopia: Secondary | ICD-10-CM | POA: Diagnosis not present

## 2023-11-29 DIAGNOSIS — H2513 Age-related nuclear cataract, bilateral: Secondary | ICD-10-CM | POA: Diagnosis not present

## 2023-11-30 ENCOUNTER — Encounter: Payer: Self-pay | Admitting: Nurse Practitioner

## 2023-11-30 ENCOUNTER — Other Ambulatory Visit: Payer: Self-pay | Admitting: Nurse Practitioner

## 2023-11-30 ENCOUNTER — Ambulatory Visit: Payer: Medicare Other | Admitting: Nurse Practitioner

## 2023-11-30 VITALS — BP 128/82 | HR 74 | Temp 98.3°F | Resp 16 | Ht 65.0 in | Wt 150.2 lb

## 2023-11-30 DIAGNOSIS — I10 Essential (primary) hypertension: Secondary | ICD-10-CM

## 2023-11-30 DIAGNOSIS — Z0000 Encounter for general adult medical examination without abnormal findings: Secondary | ICD-10-CM | POA: Diagnosis not present

## 2023-11-30 DIAGNOSIS — E559 Vitamin D deficiency, unspecified: Secondary | ICD-10-CM

## 2023-11-30 DIAGNOSIS — E039 Hypothyroidism, unspecified: Secondary | ICD-10-CM

## 2023-11-30 DIAGNOSIS — E782 Mixed hyperlipidemia: Secondary | ICD-10-CM | POA: Diagnosis not present

## 2023-11-30 MED ORDER — LEVOTHYROXINE SODIUM 25 MCG PO TABS: ORAL_TABLET | ORAL | 4 refills | Status: AC

## 2023-11-30 MED ORDER — AMLODIPINE BESYLATE 5 MG PO TABS: 5.0000 mg | ORAL_TABLET | Freq: Every day | ORAL | 4 refills | Status: AC

## 2023-11-30 NOTE — Progress Notes (Signed)
 [facility] [address] [place], [place] [address]  Internal MEDICINE  Office Visit Note  Patient Name: Carolyn Reyes  [id number]  [id number]  Date of Service: 11/30/2023  Chief Complaint  Patient presents with   Hypertension   Medicare Wellness    HPI Carolyn Reyes presents for a medicare annual wellness visit..  Well-appearing 78 y.o. female with hypertension, hypothyroidism, high cholesterol and history of breast cancer, currently on anastrazole. Routine mammogram: gets this done through oncology.  Labs: routine labs due now.  New or worsening pain: none  Other concerns: none She is doing well, no complaints per patient.      11/30/2023    9:04 AM 11/29/2022    8:58 AM 11/23/2021    9:09 AM  MMSE - Mini Mental State Exam  Orientation to time 5 5 5   Orientation to Place 5 5 5   Registration 3 3 3   Attention/ Calculation 5 5 5   Recall 3 3 3   Language- name 2 objects 2 2 2   Language- repeat 1 1 1   Language- follow 3 step command 3 3 3   Language- read & follow direction 1 1 1   Write a sentence 1 1 1   Copy design 1 1 1   Total score 30 30 30     Functional Status Survey: Is the patient deaf or have difficulty hearing?: No Does the patient have difficulty seeing, even when wearing glasses/contacts?: No Does the patient have difficulty concentrating, remembering, or making decisions?: No Does the patient have difficulty walking or climbing stairs?: No Does the patient have difficulty dressing or bathing?: No Does the patient have difficulty doing errands alone such as visiting a doctor's office or shopping?: No     11/19/2020    8:35 AM 11/23/2021    9:07 AM 02/11/2022    8:31 AM 11/29/2022    8:57 AM 11/30/2023    9:03 AM  Fall Risk  Falls in the past year? 0 0  0 0  Was there an injury with Fall?    0 0  Fall Risk Category Calculator    0 0  (RETIRED) Patient Fall Risk Level  Low fall risk  Low fall risk     Patient at Risk for Falls Due to  No Fall Risks  No  Fall Risks No Fall Risks  Fall risk Follow up  Falls evaluation completed   Falls evaluation completed Falls evaluation completed     Data saved with a previous flowsheet row definition       11/30/2023    9:03 AM  Depression screen PHQ 2/9  Decreased Interest 0  Down, Depressed, Hopeless 0  PHQ - 2 Score 0        Current Medication: Outpatient Encounter Medications as of 11/30/2023  Medication Sig   amLODipine  (NORVASC ) 5 MG tablet Take 1 tablet (5 mg total) by mouth at bedtime.   anastrozole (ARIMIDEX) 1 MG tablet Take 1 mg by mouth daily.   aspirin 81 MG chewable tablet Chew 81 mg by mouth daily.   Calcium Carbonate-Vit D-Min (CALCIUM 600+D3 PLUS MINERALS) 600-800 MG-UNIT TABS Take by mouth.   levothyroxine  (SYNTHROID ) 25 MCG tablet TAKE 1 TABLET BY MOUTH DAILY 1  HOUR BEFORE BREAKFAST   UNABLE TO FIND prn   [DISCONTINUED] amLODipine  (NORVASC ) 5 MG tablet TAKE 1 TABLET BY MOUTH DAILY AT  BEDTIME   [DISCONTINUED] levothyroxine  (SYNTHROID ) 25 MCG tablet TAKE 1 TABLET BY MOUTH DAILY 1  HOUR BEFORE BREAKFAST   No facility-administered encounter  medications on file as of 11/30/2023.    Surgical History: Past Surgical History:  Procedure Laterality Date   BREAST SURGERY Left    FIBROID CYST REMOVED    COLONOSCOPY WITH PROPOFOL  N/A 02/11/2022   Procedure: COLONOSCOPY WITH PROPOFOL ;  Surgeon: Unk Corinn Skiff, MD;  Location: [facility] ENDOSCOPY;  Service: Gastroenterology;  Laterality: N/A;    Medical History: Past Medical History:  Diagnosis Date   Hypertension    Thyroid  disease     Family History: Family History  Problem Relation Age of Onset   Diabetes Mother    Hypertension Mother    Hypertension Father     Social History   Socioeconomic History   Marital status: Married    Spouse name: Not on file   Number of children: Not on file   Years of education: Not on file   Highest education level: Not on file  Occupational History   Not on file  Tobacco Use    Smoking status: Never   Smokeless tobacco: Never  Vaping Use   Vaping status: Never Used  Substance and Sexual Activity   Alcohol use: Never   Drug use: Never   Sexual activity: Not on file  Other Topics Concern   Not on file  Social History Narrative   Not on file   Social Drivers of Health   Financial Resource Strain: Not on file  Food Insecurity: Not on file  Transportation Needs: Not on file  Physical Activity: Not on file  Stress: Not on file  Social Connections: Not on file  Intimate Partner Violence: Not on file      Review of Systems  Constitutional:  Negative for activity change, appetite change, chills, fatigue, fever and unexpected weight change.  HENT: Negative.  Negative for congestion, ear pain, rhinorrhea, sore throat and trouble swallowing.   Eyes: Negative.   Respiratory: Negative.  Negative for cough, chest tightness, shortness of breath and wheezing.   Cardiovascular: Negative.  Negative for chest pain.  Gastrointestinal: Negative.  Negative for abdominal pain, blood in stool, constipation, diarrhea, nausea and vomiting.  Endocrine: Negative.   Genitourinary: Negative.  Negative for difficulty urinating, dysuria, frequency, hematuria and urgency.  Musculoskeletal: Negative.  Negative for arthralgias, back pain, joint swelling, myalgias and neck pain.  Skin: Negative.  Negative for rash and wound.  Allergic/Immunologic: Negative.  Negative for immunocompromised state.  Neurological: Negative.  Negative for dizziness, seizures, numbness and headaches.  Hematological: Negative.   Psychiatric/Behavioral: Negative.  Negative for behavioral problems, self-injury and suicidal ideas. The patient is not nervous/anxious.     Vital Signs: BP 128/82   Pulse 74   Temp 98.3 F (36.8 C)   Resp 16   Ht 5' 5 (1.651 m)   Wt 150 lb 3.2 oz (68.1 kg)   SpO2 98%   BMI 24.99 kg/m    Physical Exam Vitals reviewed.  Constitutional:      General: She is awake. She  is not in acute distress.    Appearance: Normal appearance. She is well-developed, well-groomed and normal weight. She is not ill-appearing or diaphoretic.  HENT:     Head: Normocephalic and atraumatic.     Right Ear: Tympanic membrane, ear canal and external ear normal.     Left Ear: Tympanic membrane, ear canal and external ear normal.     Nose: Nose normal. No congestion or rhinorrhea.     Mouth/Throat:     Lips: Pink.     Mouth: Mucous membranes are moist.  Pharynx: Oropharynx is clear. Uvula midline. No oropharyngeal exudate or posterior oropharyngeal erythema.  Eyes:     General: Lids are normal. Vision grossly intact. Gaze aligned appropriately.     Extraocular Movements: Extraocular movements intact.     Conjunctiva/sclera: Conjunctivae normal.     Pupils: Pupils are equal, round, and reactive to light.     Funduscopic exam:    Right eye: Red reflex present.        Left eye: Red reflex present. Neck:     Thyroid : No thyromegaly.     Vascular: No carotid bruit or JVD.     Trachea: Trachea and phonation normal. No tracheal deviation.  Cardiovascular:     Rate and Rhythm: Normal rate and regular rhythm.     Pulses: Normal pulses.     Heart sounds: Normal heart sounds, S1 normal and S2 normal. No murmur heard.    No friction rub. No gallop.  Pulmonary:     Effort: Pulmonary effort is normal. No accessory muscle usage or respiratory distress.     Breath sounds: Normal breath sounds and air entry. No wheezing or rales.  Chest:     Chest wall: No tenderness.  Breasts:    Breasts are symmetrical.     Right: Normal. No swelling, bleeding, inverted nipple, mass, nipple discharge, skin change or tenderness.     Left: Normal. No swelling, bleeding, inverted nipple, mass, nipple discharge, skin change or tenderness.  Abdominal:     General: Bowel sounds are normal.     Palpations: Abdomen is soft. There is no shifting dullness, fluid wave, mass or pulsatile mass.     Tenderness:  There is no abdominal tenderness.  Musculoskeletal:        General: Normal range of motion.     Cervical back: Normal range of motion and neck supple.     Right lower leg: No edema.     Left lower leg: No edema.  Lymphadenopathy:     Cervical: No cervical adenopathy.     Upper Body:     Right upper body: No supraclavicular, axillary or pectoral adenopathy.     Left upper body: No supraclavicular, axillary or pectoral adenopathy.  Skin:    General: Skin is warm and dry.     Capillary Refill: Capillary refill takes less than 2 seconds.  Neurological:     Mental Status: She is alert and oriented to person, place, and time.     Cranial Nerves: No cranial nerve deficit.  Psychiatric:        Mood and Affect: Mood normal.        Behavior: Behavior normal. Behavior is cooperative.        Thought Content: Thought content normal.        Judgment: Judgment normal.        Assessment/Plan: 1. Encounter for subsequent annual wellness visit (AWV) in Medicare patient (Primary) Age-appropriate preventive screenings and vaccinations discussed. Routine labs for health maintenance will be ordered. PHM updated.    2. Essential hypertension Stable, continue amlodipine  as prescribed.  - amLODipine  (NORVASC ) 5 MG tablet; Take 1 tablet (5 mg total) by mouth at bedtime.  Dispense: 100 tablet; Refill: 4  3. Acquired hypothyroidism Continue levothyroxine  as prescribed. Will check thyroid  labs  - levothyroxine  (SYNTHROID ) 25 MCG tablet; TAKE 1 TABLET BY MOUTH DAILY 1  HOUR BEFORE BREAKFAST  Dispense: 100 tablet; Refill: 4  4. Mixed hyperlipidemia Started a fish oil supplement last year, continue as discussed, will review lipid  panel.   5. Vitamin D  deficiency Takes OTC supplement. Continue if desired, will review labs.    General Counseling: Carolyn Reyes verbalizes understanding of the findings of todays visit and agrees with plan of treatment. I have discussed any further diagnostic evaluation that may  be needed or ordered today. We also reviewed her medications today. she has been encouraged to call the office with any questions or concerns that should arise related to todays visit.    No orders of the defined types were placed in this encounter.   Meds ordered this encounter  Medications   amLODipine  (NORVASC ) 5 MG tablet    Sig: Take 1 tablet (5 mg total) by mouth at bedtime.    Dispense:  100 tablet    Refill:  4    For future refills, keep on file.   levothyroxine  (SYNTHROID ) 25 MCG tablet    Sig: TAKE 1 TABLET BY MOUTH DAILY 1  HOUR BEFORE BREAKFAST    Dispense:  100 tablet    Refill:  4    For future refills, keep on file    Return in about 1 year (around 11/29/2024) for AWV, Sofhia Ulibarri PCP and otherwise as needed.   Total time spent:30 Minutes Time spent includes review of chart, medications, test results, and follow up plan with the patient.   [place] Controlled Substance Database was reviewed by me.  This patient was seen by Mardy Maxin, FNP-C in collaboration with Dr. Sigrid Bathe as a part of collaborative care agreement.  Taleya Whitcher R. Maxin, MSN, FNP-C Internal medicine

## 2023-12-01 LAB — COMPREHENSIVE METABOLIC PANEL WITH GFR
ALT: 26 IU/L (ref 0–32)
AST: 42 IU/L — ABNORMAL HIGH (ref 0–40)
Albumin: 4.3 g/dL (ref 3.8–4.8)
Alkaline Phosphatase: 116 IU/L (ref 44–121)
BUN/Creatinine Ratio: 14 (ref 12–28)
BUN: 11 mg/dL (ref 8–27)
Bilirubin Total: 0.4 mg/dL (ref 0.0–1.2)
CO2: 20 mmol/L (ref 20–29)
Calcium: 10 mg/dL (ref 8.7–10.3)
Chloride: 105 mmol/L (ref 96–106)
Creatinine, Ser: 0.78 mg/dL (ref 0.57–1.00)
Globulin, Total: 2.4 g/dL (ref 1.5–4.5)
Glucose: 101 mg/dL — ABNORMAL HIGH (ref 70–99)
Potassium: 4.5 mmol/L (ref 3.5–5.2)
Sodium: 141 mmol/L (ref 134–144)
Total Protein: 6.7 g/dL (ref 6.0–8.5)
eGFR: 78 mL/min/1.73 (ref >59)

## 2023-12-01 LAB — CBC WITH DIFFERENTIAL/PLATELET
Basophils Absolute: 0 x10E3/uL (ref 0.0–0.2)
Basos: 1 %
EOS (ABSOLUTE): 0.1 x10E3/uL (ref 0.0–0.4)
Eos: 3 %
Hematocrit: 43.7 % (ref 34.0–46.6)
Hemoglobin: 14 g/dL (ref 11.1–15.9)
Immature Grans (Abs): 0 x10E3/uL (ref 0.0–0.1)
Immature Granulocytes: 0 %
Lymphocytes Absolute: 1.5 x10E3/uL (ref 0.7–3.1)
Lymphs: 37 %
MCH: 28.5 pg (ref 26.6–33.0)
MCHC: 32 g/dL (ref 31.5–35.7)
MCV: 89 fL (ref 79–97)
Monocytes Absolute: 0.3 x10E3/uL (ref 0.1–0.9)
Monocytes: 7 %
Neutrophils Absolute: 2.2 x10E3/uL (ref 1.4–7.0)
Neutrophils: 52 %
Platelets: 248 x10E3/uL (ref 150–450)
RBC: 4.91 x10E6/uL (ref 3.77–5.28)
RDW: 13 % (ref 11.7–15.4)
WBC: 4.1 x10E3/uL (ref 3.4–10.8)

## 2023-12-01 LAB — LIPID PANEL
Chol/HDL Ratio: 3.3 ratio (ref 0.0–4.4)
Cholesterol, Total: 211 mg/dL — ABNORMAL HIGH (ref 100–199)
HDL: 64 mg/dL (ref >39)
LDL Chol Calc (NIH): 122 mg/dL — ABNORMAL HIGH (ref 0–99)
Triglycerides: 141 mg/dL (ref 0–149)
VLDL Cholesterol Cal: 25 mg/dL (ref 5–40)

## 2023-12-01 LAB — TSH: TSH: 0.822 u[IU]/mL (ref 0.450–4.500)

## 2023-12-01 LAB — T4, FREE: Free T4: 1.13 ng/dL (ref 0.82–1.77)

## 2023-12-01 LAB — VITAMIN D 25 HYDROXY (VIT D DEFICIENCY, FRACTURES): Vit D, 25-Hydroxy: 60.1 ng/mL (ref 30.0–100.0)

## 2024-01-01 DIAGNOSIS — Z170 Estrogen receptor positive status [ER+]: Secondary | ICD-10-CM | POA: Diagnosis not present

## 2024-01-01 DIAGNOSIS — C50811 Malignant neoplasm of overlapping sites of right female breast: Secondary | ICD-10-CM | POA: Diagnosis not present

## 2024-01-01 DIAGNOSIS — Z1231 Encounter for screening mammogram for malignant neoplasm of breast: Secondary | ICD-10-CM | POA: Diagnosis not present

## 2024-02-08 ENCOUNTER — Ambulatory Visit: Payer: Self-pay | Admitting: Nurse Practitioner

## 2024-02-08 NOTE — Progress Notes (Signed)
 Labs are grossly normal except slightly elevated cholesterol but this is improved from last year. No changes in diet, medications or interventions. Repeat labs in 1 year.

## 2024-12-03 ENCOUNTER — Ambulatory Visit: Admitting: Nurse Practitioner
